# Patient Record
Sex: Male | Born: 1975 | Race: White | Hispanic: No | Marital: Married | Smoking: Never smoker
Health system: Southern US, Community
[De-identification: ages and names within clinical notes are randomized; demographics above are authoritative.]

## PROBLEM LIST (undated history)

## (undated) DIAGNOSIS — M1990 Unspecified osteoarthritis, unspecified site: Secondary | ICD-10-CM

## (undated) DIAGNOSIS — R42 Dizziness and giddiness: Secondary | ICD-10-CM

## (undated) DIAGNOSIS — Z136 Encounter for screening for cardiovascular disorders: Secondary | ICD-10-CM

## (undated) DIAGNOSIS — T8859XA Other complications of anesthesia, initial encounter: Secondary | ICD-10-CM

## (undated) DIAGNOSIS — G43909 Migraine, unspecified, not intractable, without status migrainosus: Secondary | ICD-10-CM

## (undated) DIAGNOSIS — K219 Gastro-esophageal reflux disease without esophagitis: Secondary | ICD-10-CM

## (undated) HISTORY — PX: WISDOM TOOTH EXTRACTION: SHX21

## (undated) HISTORY — DX: Dizziness and giddiness: R42

## (undated) HISTORY — PX: REFRACTIVE SURGERY: SHX103

## (undated) HISTORY — PX: OTHER SURGICAL HISTORY: SHX169

## (undated) HISTORY — PX: KNEE SURGERY: SHX244

## (undated) HISTORY — DX: Encounter for screening for cardiovascular disorders: Z13.6

## (undated) HISTORY — PX: SHOULDER SURGERY: SHX246

---

## 2000-05-25 ENCOUNTER — Emergency Department (HOSPITAL_COMMUNITY): Admission: EM | Admit: 2000-05-25 | Discharge: 2000-05-25 | Payer: Self-pay | Admitting: Emergency Medicine

## 2000-05-25 ENCOUNTER — Encounter: Payer: Self-pay | Admitting: Emergency Medicine

## 2000-05-28 ENCOUNTER — Emergency Department (HOSPITAL_COMMUNITY): Admission: EM | Admit: 2000-05-28 | Discharge: 2000-05-28 | Payer: Self-pay | Admitting: Emergency Medicine

## 2000-05-31 ENCOUNTER — Emergency Department (HOSPITAL_COMMUNITY): Admission: EM | Admit: 2000-05-31 | Discharge: 2000-05-31 | Payer: Self-pay | Admitting: Emergency Medicine

## 2001-07-13 ENCOUNTER — Emergency Department (HOSPITAL_COMMUNITY): Admission: EM | Admit: 2001-07-13 | Discharge: 2001-07-13 | Payer: Self-pay | Admitting: Emergency Medicine

## 2001-07-14 ENCOUNTER — Encounter: Payer: Self-pay | Admitting: Emergency Medicine

## 2019-10-27 ENCOUNTER — Telehealth: Payer: Self-pay

## 2019-10-27 NOTE — Telephone Encounter (Signed)
error 

## 2019-10-27 NOTE — Progress Notes (Signed)
CARDIOLOGY CONSULT NOTE       Patient ID: James Powers MRN: [id number] DOB/AGE: April 21, 1976 44 y.o.  Admit date: (Not on file) Referring Physician: Nevada Crane Primary Physician: [name], MD Primary Cardiologist: New Reason for Consultation: Palpitations  Active Problems:   * No active hospital problems. *   HPI:  44 y.o. 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 He had some dizziness and paresthesias' in his left arm after lugging a bunch of tanks for class and getting hypothermic giving his 5 mm wet suit to a student   ROS All other systems reviewed and negative except as noted above  Past Medical History:  Diagnosis Date  . Dizziness and giddiness   . Screening for cardiovascular condition     No family history on file.  Social History   Socioeconomic History  . Marital status: Married    Spouse name: Not on file  . Number of children: Not on file  . Years of education: Not on file  . Highest education level: Not on file  Occupational History  . Not on file  Tobacco Use  . Smoking status: Never Smoker  . Smokeless tobacco: Never Used  Vaping Use  . Vaping Use: Never used  Substance and Sexual Activity  . Alcohol use: Not on file  . Drug use: Not on file  . Sexual activity: Not on file  Other Topics Concern  . Not on file  Social History Narrative  . Not on file   Social Determinants of Health   Financial Resource Strain:   . Difficulty of Paying Living Expenses:   Food Insecurity:   . Worried  About Charity fundraiser in the Last Year:   . Arboriculturist in the Last Year:   Transportation Needs:   . Film/video editor (Medical):   Marland Kitchen Lack of Transportation (Non-Medical):   Physical Activity:   . Days of Exercise per Week:   . Minutes of Exercise per Session:   Stress:   . Feeling of Stress :   Social Connections:   . Frequency of Communication with Friends and Family:   . Frequency of Social Gatherings with Friends and Family:   . Attends Religious Services:   . Active Member of Clubs or Organizations:   . Attends Archivist Meetings:   Marland Kitchen Marital Status:   Intimate Partner Violence:   . Fear of Current or Ex-Partner:   . Emotionally Abused:   Marland Kitchen Physically Abused:   . Sexually Abused:      No current outpatient medications on file.  Physical Exam: Blood pressure 116/64, pulse 69, height 5\' 10"  (1.778 m), weight 200 lb 9.6 oz (91 kg), SpO2 94 %.   Affect appropriate Healthy:  appears stated age 44: normal Neck supple with no adenopathy JVP normal no bruits no thyromegaly Lungs clear with no wheezing and good diaphragmatic motion Heart:  S1/S2 no murmur, no rub, gallop or click PMI  normal Abdomen: benighn, BS positve, no tenderness, no AAA no bruit.  No HSM or HJR Distal pulses intact with no bruits No edema Neuro non-focal Skin warm and dry No muscular weakness   Labs:  No results found for: WBC, HGB, HCT, MCV, PLT No results for input(s): NA, K, CL, CO2, BUN, CREATININE, CALCIUM, PROT, BILITOT, ALKPHOS, ALT, AST, GLUCOSE in the last 168 hours.  Invalid input(s): LABALBU No results found for: CKTOTAL, CKMB, CKMBINDEX, TROPONINI No results found for: CHOL No results found for: HDL No results found for: LDLCALC No results found for: TRIG No results found for: CHOLHDL No results found for: LDLDIRECT    Radiology: No results found.  EKG: see HPI  Normal    ASSESSMENT AND PLAN:   1. Lightheadedness:  Benign related to hypothermia  not postural normal vitals / ECG 2. PAC:  Resolved no arrhythmia in office today  3. Chest Pain atypical likely muscular f/u ETT and Echo given active lifestyle and scuba diving    Signed: Jenkins Rouge 11/05/2019, 3:34 PM

## 2019-11-05 ENCOUNTER — Other Ambulatory Visit: Payer: Self-pay

## 2019-11-05 ENCOUNTER — Ambulatory Visit (INDEPENDENT_AMBULATORY_CARE_PROVIDER_SITE_OTHER): Payer: No Typology Code available for payment source | Admitting: Cardiovascular Disease

## 2019-11-05 ENCOUNTER — Encounter: Payer: Self-pay | Admitting: Cardiovascular Disease

## 2019-11-05 VITALS — BP 116/64 | HR 69 | Ht 70.0 in | Wt 200.6 lb

## 2019-11-05 DIAGNOSIS — R079 Chest pain, unspecified: Secondary | ICD-10-CM | POA: Diagnosis not present

## 2019-11-05 DIAGNOSIS — R42 Dizziness and giddiness: Secondary | ICD-10-CM

## 2019-11-05 NOTE — Patient Instructions (Signed)
Medication Instructions:  Your physician recommends that you continue on your current medications as directed. Please refer to the Current Medication list given to you today.  *If you need a refill on your cardiac medications before your next appointment, please call your pharmacy*   Lab Work: None today If you have labs (blood work) drawn today and your tests are completely normal, you will receive your results only by: Marland Kitchen MyChart Message (if you have MyChart) OR . A paper copy in the mail If you have any lab test that is abnormal or we need to change your treatment, we will call you to review the results.   Testing/Procedures: Your physician has requested that you have an exercise tolerance test. For further information please visit [email or web address]. Please also follow instruction sheet, as given.  Your physician has requested that you have an echocardiogram. Echocardiography is a painless test that uses sound waves to create images of your heart. It provides your doctor with information about the size and shape of your heart and how well your heart's chambers and valves are working. This procedure takes approximately one hour. There are no restrictions for this procedure.     Follow-Up: At [facility], you and your health needs are our priority.  As part of our continuing mission to provide you with exceptional heart care, we have created designated Provider Care Teams.  These Care Teams include your primary Cardiologist (physician) and Advanced Practice Providers (APPs -  Physician Assistants and Nurse Practitioners) who all work together to provide you with the care you need, when you need it.  We recommend signing up for the patient portal called "MyChart".  Sign up information is provided on this After Visit Summary.  MyChart is used to connect with patients for Virtual Visits (Telemedicine).  Patients are able to view lab/test results, encounter notes, upcoming appointments,  etc.  Non-urgent messages can be sent to your provider as well.   To learn more about what you can do with MyChart, go to [email or web address].    Your next appointment:  We will call you with results of testing      Thank you for choosing [facility] !

## 2019-12-11 ENCOUNTER — Other Ambulatory Visit: Payer: Self-pay

## 2019-12-11 ENCOUNTER — Other Ambulatory Visit (HOSPITAL_COMMUNITY)
Admission: RE | Admit: 2019-12-11 | Discharge: 2019-12-11 | Disposition: A | Discharge status: Home / Self Care | Payer: No Typology Code available for payment source | Source: Ambulatory Visit | Attending: Cardiovascular Disease | Admitting: Cardiovascular Disease

## 2019-12-11 DIAGNOSIS — Z20822 Contact with and (suspected) exposure to covid-19: Secondary | ICD-10-CM | POA: Diagnosis not present

## 2019-12-11 DIAGNOSIS — Z01812 Encounter for preprocedural laboratory examination: Secondary | ICD-10-CM | POA: Insufficient documentation

## 2019-12-11 LAB — SARS CORONAVIRUS 2 (TAT 6-24 HRS): SARS Coronavirus 2: NEGATIVE

## 2019-12-14 ENCOUNTER — Ambulatory Visit (HOSPITAL_COMMUNITY)
Admission: RE | Admit: 2019-12-14 | Discharge: 2019-12-14 | Disposition: A | Discharge status: Home / Self Care | Payer: No Typology Code available for payment source | Source: Ambulatory Visit | Attending: Cardiovascular Disease | Admitting: Cardiovascular Disease

## 2019-12-14 ENCOUNTER — Ambulatory Visit (HOSPITAL_BASED_OUTPATIENT_CLINIC_OR_DEPARTMENT_OTHER)
Admission: RE | Admit: 2019-12-14 | Discharge: 2019-12-14 | Disposition: A | Discharge status: Home / Self Care | Payer: No Typology Code available for payment source | Source: Ambulatory Visit | Attending: Cardiovascular Disease | Admitting: Cardiovascular Disease

## 2019-12-14 ENCOUNTER — Other Ambulatory Visit: Payer: Self-pay

## 2019-12-14 DIAGNOSIS — R079 Chest pain, unspecified: Secondary | ICD-10-CM | POA: Diagnosis not present

## 2019-12-14 LAB — EXERCISE TOLERANCE TEST
Estimated workload: 17.2 METS
Exercise duration (min): 14 min
Exercise duration (sec): 0 s
MPHR: 175 {beats}/min
Peak HR: 169 {beats}/min
Percent HR: 96 %
RPE: 13
Rest HR: 62 {beats}/min

## 2019-12-14 LAB — ECHOCARDIOGRAM COMPLETE
Area-P 1/2: 4.46 cm2
S' Lateral: 3.22 cm

## 2019-12-14 NOTE — Progress Notes (Signed)
*  PRELIMINARY RESULTS* Echocardiogram 2D Echocardiogram has been performed.  Samuel Germany 12/14/2019, 12:43 PM

## 2020-08-14 ENCOUNTER — Other Ambulatory Visit: Payer: Self-pay

## 2020-08-14 ENCOUNTER — Ambulatory Visit (INDEPENDENT_AMBULATORY_CARE_PROVIDER_SITE_OTHER): Payer: 59

## 2020-08-14 ENCOUNTER — Ambulatory Visit
Admission: RE | Admit: 2020-08-14 | Discharge: 2020-08-14 | Disposition: A | Discharge status: Home / Self Care | Payer: 59 | Source: Ambulatory Visit | Attending: Physician Assistant | Admitting: Physician Assistant

## 2020-08-14 VITALS — BP 129/89 | HR 63 | Temp 97.9°F | Resp 19

## 2020-08-14 DIAGNOSIS — S90111A Contusion of right great toe without damage to nail, initial encounter: Secondary | ICD-10-CM

## 2020-08-14 DIAGNOSIS — M79674 Pain in right toe(s): Secondary | ICD-10-CM

## 2020-08-14 NOTE — ED Provider Notes (Signed)
[facility]    CSN: [id number] Arrival date & time: 08/14/20  1301      History   Chief Complaint Chief Complaint  Patient presents with  . Toe Injury    HPI James Powers is a 45 y.o. male.   Pt presents with right great to pain that started on Thursday.  Reports he injured the toe while doing taekwondo.  Reports nail pushed in, had some mild bleeding at the base of the nail.  Nail is intact.  He reports the next day someone stepped on his toe and now he is experiencing increased redness and swelling to the great toe.  Reports pain is worse with walking.  No other injuries.       Past Medical History:  Diagnosis Date  . Dizziness and giddiness   . Screening for cardiovascular condition     There are no problems to display for this patient.   Past Surgical History:  Procedure Laterality Date  . KNEE SURGERY Left   . SHOULDER SURGERY Left        Home Medications    Prior to Admission medications   Not on File    Family History No family history on file.  Social History Social History   Tobacco Use  . Smoking status: Never Smoker  . Smokeless tobacco: Never Used  Vaping Use  . Vaping Use: Never used     Allergies   Tomato   Review of Systems Review of Systems  Constitutional: Negative for chills and fever.  HENT: Negative for ear pain and sore throat.   Eyes: Negative for pain and visual disturbance.  Respiratory: Negative for cough and shortness of breath.   Cardiovascular: Negative for chest pain and palpitations.  Gastrointestinal: Negative for abdominal pain and vomiting.  Genitourinary: Negative for dysuria and hematuria.  Musculoskeletal: Positive for arthralgias (right great toe pain). Negative for back pain.  Skin: Negative for color change, rash and wound.  Neurological: Negative for seizures and syncope.  All other systems reviewed and are negative.    Physical Exam Triage Vital Signs ED Triage Vitals  Enc Vitals  Group     BP 08/14/20 1314 129/89     Pulse Rate 08/14/20 1314 63     Resp 08/14/20 1314 19     Temp 08/14/20 1314 97.9 F (36.6 C)     Temp Source 08/14/20 1314 Oral     SpO2 08/14/20 1314 98 %     Weight --      Height --      Head Circumference --      Peak Flow --      Pain Score 08/14/20 1312 2     Pain Loc --      Pain Edu? --      Excl. in [place]? --    No data found.  Updated Vital Signs BP 129/89 (BP Location: Right Arm)   Pulse 63   Temp 97.9 F (36.6 C) (Oral)   Resp 19   SpO2 98%   Visual Acuity Right Eye Distance:   Left Eye Distance:   Bilateral Distance:    Right Eye Near:   Left Eye Near:    Bilateral Near:     Physical Exam Vitals and nursing note reviewed.  Constitutional:      Appearance: He is well-developed.  HENT:     Head: Normocephalic and atraumatic.  Eyes:     Conjunctiva/sclera: Conjunctivae normal.  Cardiovascular:     Rate  and Rhythm: Normal rate and regular rhythm.     Heart sounds: No murmur heard.   Pulmonary:     Effort: Pulmonary effort is normal. No respiratory distress.     Breath sounds: Normal breath sounds.  Abdominal:     Palpations: Abdomen is soft.     Tenderness: There is no abdominal tenderness.  Musculoskeletal:     Cervical back: Neck supple.     Left foot: Normal range of motion. Normal pulse.     Comments: Redness along the base of the right great toe nail bed with mild swelling.  Possible paronychia vs. Injury, no fluctuance noted.  Nail is intact with no nail damage noted.    Skin:    General: Skin is warm and dry.  Neurological:     Mental Status: He is alert.      UC Treatments / Results  Labs (all labs ordered are listed, but only abnormal results are displayed) Labs Reviewed - No data to display  EKG   Radiology DG Toe Great Right  Result Date: 08/14/2020 CLINICAL DATA:  Right great toe pain and swelling EXAM: RIGHT GREAT TOE COMPARISON:  None. FINDINGS: There is no evidence of fracture or  dislocation. Minimal osteophytic spurring of the first MTP joint. Soft tissues are unremarkable. IMPRESSION: Negative. Electronically Signed   By: Davina Poke D.O.   On: 08/14/2020 13:39    Procedures Procedures (including critical care time)  Medications Ordered in UC Medications - No data to display  Initial Impression / Assessment and Plan / UC Course  I have reviewed the triage vital signs and the nursing notes.  Pertinent labs & imaging results that were available during my care of the patient were reviewed by me and considered in my medical decision making (see chart for details).     Negative images.  Great toe contusion vs. Possible paronychia although no fluctuance noted.  Advised warm soaks and follow up if swelling redness persists/becomes worse.  Otherwise treat as contusion, ice, elevation, ibuprofen.  Final Clinical Impressions(s) / UC Diagnoses   Final diagnoses:  Contusion of right great toe without damage to nail, initial encounter     Discharge Instructions     Apply ice to affected areas as needed for pain Recommend warm soaks for possibly paronychia Ibuprofen or tylenol as needed for pain Return if swelling, redness around toenail increases   ED Prescriptions    None     PDMP not reviewed this encounter.   Konrad Felix, PA-C 08/15/20 1842

## 2020-08-14 NOTE — ED Triage Notes (Signed)
Injury to RT great toe on Thursday.  Friday a kid stepped on toe and is now having swelling/ pain.

## 2020-08-14 NOTE — Discharge Instructions (Addendum)
Apply ice to affected areas as needed for pain Recommend warm soaks for possibly paronychia Ibuprofen or tylenol as needed for pain Return if swelling, redness around toenail increases

## 2020-12-02 ENCOUNTER — Ambulatory Visit (INDEPENDENT_AMBULATORY_CARE_PROVIDER_SITE_OTHER): Payer: 59 | Admitting: Allergy & Immunology

## 2020-12-02 ENCOUNTER — Other Ambulatory Visit: Payer: Self-pay

## 2020-12-02 ENCOUNTER — Encounter: Payer: Self-pay | Admitting: Allergy & Immunology

## 2020-12-02 VITALS — BP 124/72 | HR 75 | Temp 98.2°F | Resp 18 | Ht 70.0 in | Wt 222.4 lb

## 2020-12-02 DIAGNOSIS — L508 Other urticaria: Secondary | ICD-10-CM | POA: Diagnosis not present

## 2020-12-02 DIAGNOSIS — T781XXD Other adverse food reactions, not elsewhere classified, subsequent encounter: Secondary | ICD-10-CM

## 2020-12-02 DIAGNOSIS — T7840XA Allergy, unspecified, initial encounter: Secondary | ICD-10-CM | POA: Insufficient documentation

## 2020-12-02 DIAGNOSIS — T7840XD Allergy, unspecified, subsequent encounter: Secondary | ICD-10-CM

## 2020-12-02 MED ORDER — EPINEPHRINE 0.3 MG/0.3ML IJ SOAJ: 0.3000 mg | INTRAMUSCULAR | 1 refills | Status: DC | PRN

## 2020-12-02 MED ORDER — FAMOTIDINE 20 MG PO TABS: 20.0000 mg | ORAL_TABLET | Freq: Two times a day (BID) | ORAL | 5 refills | Status: DC

## 2020-12-02 NOTE — Progress Notes (Signed)
NEW PATIENT  Date of Service/Encounter:  12/02/20  Consult requested by: [name], MD   Assessment:   Allergic reaction - unknown trigger  Chronic urticaria   James Powers presents for an evaluation of 2 different distinct atopic etiologies.  One of these is a rash, which sounds consistent with cholinergic urticaria.  However, he does not have pictures to confirm this.  The other is whole body hives, which seem to be occurring with increasing frequency.  He has not been able to find out a trigger of the symptoms.  Testing today is notable for nonreactive findings to the entire food panel. We are going to get some labs to rule out serious causes of hives/swelling.  In the interim, we are putting him on suppressive doses of antihistamines.  We will call him in 1 to 2 weeks with the results of the testing.  We are providing him with training for an epinephrine autoinjector.  We are also giving him a topical steroid that he can use on this rash.  Plan/Recommendations:   1. Allergic reaction - unknown trigger - I am not sure what is going on with you. - All of the testing we did today was negative. - There is a the low positive predictive value of food allergy testing and hence the high possibility of false positives. - In contrast, food allergy testing has a high negative predictive value, therefore if testing is negative we can be relatively assured that they are indeed negative.  - I would go ahead and put wheat back into your diet since it did not seem to help your symptoms anyway. - Your allergy level was not concerning from my perspective. - Copy of testing results provided. - There is no test to look for any kind of sugar allergy because you really cannot develop an allergy to sugars (it needs to be a protein for your immune system to react to it).  - However, you could have some kind of sugar intolerance. - I am not sure what are causing your reactions, but I do worry that they could  progress to become something more serious. - Therefore, I am going to send in an Auvi-Q (epinephrine autoinjector) to have on hand if needed. - It should be only a $25 co-pay and they will send you the medication in the mail. - Anaphylaxis management plan provided.  2. Chronic urticaria - I want some pictures of this acne-like rash when it happens. - I am going to add on topical triamcinolone to apply to the lesions to see if this helps at all (avoid the face, but you can use it over the rest of your body). - I am going to add on hydrocortisone 2.5% cream to apply to the lesions to see if this helps at all (SAFE to use on the face). - Your history does not have any "red flags" such as fevers, joint pains, or permanent skin changes that would be concerning for a more serious cause of hives.  - We will get some labs to rule out serious causes of hives: alpha gal panel, tryptase level, chronic urticaria panel, stinging insect panel, ESR, and CRP. - Chronic hives are often times a self limited process and will "burn themselves out" over 6-12 months, although this is not always the case.  - In the meantime, start suppressive dosing of antihistamines to help prevent these rashes.   - Morning: Allegra (fexofenadine) 130m EVERY DAY  - Evening: Zyrtec (cetirizine) 144mEVERY  DAY  - If the above is not working, try adding: Pepcid (famotidine) 64m - You can change this dosing at home, decreasing the dose as needed or increasing the dosing as needed.  - If you are not tolerating the medications or are tired of taking them every day, we can start treatment with a monthly injectable medication called Xolair.   3. Return in about 6 weeks (around 01/13/2021).     This note in its entirety was forwarded to the Provider who requested this consultation.  Subjective:   James Powers a 45y.o. male presenting today for evaluation of  Chief Complaint  Patient presents with   Allergy Testing    Off of  all antihistamines - wheat, tomatoes, peppers(jalapeno)    Allergic Reaction    Past 6-7 years would break out randomly with red raised bumps all started after getting bitten by a copper head snake July 4, 20214. When through anaphylactic shock randomly during playing basketball, and had a couple of times where he had random swelling and red raised bumps. Tried not eating meat, tomatoes, wheat, and still not sure the cause.    Other    Thought it was bed bugs and that was not the cause and is not sure if its food related. Swelling, itching, headaches, red raised bumps.     James Powers a history of the following: Patient Active Problem List   Diagnosis Date Noted   Chronic urticaria 12/02/2020   Allergic reaction 12/02/2020     History obtained from: chart review and patient.  BDenver Fasterwas referred by [name].     James Powers a 45y.o. male presenting for an evaluation of allergic reactions.  He attributes this to symptoms following his snake bite. Symptoms started in September 2019. He was scratching the sides of his head and he was extremely pruritic. He was "not looking good" and his lip and tongues were swollen. He went to [facility] and popped some Benadryl. He went home and felt that he should have gone to the ED. HE was purple head to toe and had hives everywhere. These were not the itchy dots but completely different. He was cleared up within 2 hours. He had eaten [name] with James Powers   In September 2020, he started having similar symptoms when he was outdoors working in the yard. He cooled down and sat down and took Benadryl with improvement in his symptoms. He had eaten fajitas for lunch but this was 2 hours prior. He was playing basketball at that time.   He was vegetarian for two months without improvement. He avoided red meats without improvement.   In April 2021, around midnight, he woke up and went to the bathroom and stayed awake for one  hour. It never got as bad as the first time because. In July 2021 and October 2021, he had similar episodes. Both of these others were around 4am or so.   He has avoided tomatoes, spicy foods, and acidic foods. He denies any stings at all.   In September 2021, he developed headaches. As part of this headache workup, he did wheat testing that positive. He has been avoiding gluten since March 2022. This did help at first but then it resumed. He has having less outbreaks as he used to. He thought the wheat might be causing the headaches. They are better going to a massage therapist, around once weekly for the past couple of months.  He reports migraines and vertigo on Monday or Tuesday night. He does not drink coffee or soft drinks during the week, only on the weekends. He decreased the caffeine intake and this improved his migraines. He has avoided all caffeine and he has not had migraines since stopping in December 2021.  Review of his lab results showed complete blood count that was normal.  Metabolic panel was normal as well.  He did have celiac testing that was normal.  Inflammatory markers were normal.  Vitamin D was slightly low.  Thyroid testing was normal.  Wheat IgE was 3.10.  Vitamin B-12 was normal.  His wife is a Child psychotherapist a he has noticed that he develops headaches over the next couple of days. All of his food allergies are delayed. They are never associated with medications. He does not use ibuprofen often at all.   In between all of these, he reports that the bumps would look like acne. It would appear on the cheeks and forehead and on the tops of his head. He also has them on his head. They are extremely itchy and not grouped together. They are very scattered. They occur randomly and they would remain for 4-5 days with a few weeks between them. He is outside a lot and during the summer he would develop the bites. He has had the house sprayer and bed bugs checked. These are what developed after the  copperhead bite in July 2014.   Of note, his brother developed similar symptoms. He has needed to call 911 and he carries an EpiPen. Alick does not carry an EpiPen at all. He does not talk to his brother very often.   He does report that he tends to require more local anesthetics and general anesthetics during procedures.  He has woken up during a knee procedure as well as a dental procedure.  He tells me that his last procedure was a shoulder injury and he told his orthopedic surgeon about this who sent a "protein test" which was positive.  He does not know any more details of this, but he tells me that this helped his orthopedic surgeon to choose an anesthetic for the surgery.  In any case, this was 2013 and the records have likely been destroyed or would be difficult to find.  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  He otherwise has no exciting exposures.  Otherwise, there is no history of other atopic diseases, including asthma, drug allergies, stinging insect allergies, or contact dermatitis. There is no significant infectious history. Vaccinations are up to date.    Past Medical History: Patient Active Problem List   Diagnosis Date Noted   Chronic urticaria 12/02/2020   Allergic reaction 12/02/2020     Medication List:  Allergies as of 12/02/2020       Reactions   Tomato Hives        Medication List        Accurate as of December 02, 2020  1:17 PM. If you have any questions, ask your nurse or doctor.          EPINEPHrine 0.3 mg/0.3 mL Soaj injection Commonly known as: Auvi-Q Inject 0.3 mg into the muscle as needed for anaphylaxis. Started by: Valentina Shaggy, MD   famotidine 20 MG tablet Commonly known as: PEPCID Take 1 tablet (20 mg total) by mouth 2 (two) times daily. Started by: Valentina Shaggy, MD        Birth  History: non-contributory  Developmental History: non-contributory  Past Surgical History: Past  Surgical History:  Procedure Laterality Date   KNEE SURGERY Left    SHOULDER SURGERY Left      Family History: History reviewed. No pertinent family history.   Social History: Trevelle lives at home with his wife and three daughters.  Does have a house that is 55 years old.  There is laminate and carpeting throughout the home.  They have electric heating and central cooling.  There are no animals inside or outside of the home.  There are no dust mite covers on the bedding.  There is no tobacco exposure.  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  He is not exposed to fumes, chemicals, or dust.  He does not have a HEPA filter.  There is no tobacco exposure.   Review of Systems  Constitutional: Negative.  Negative for chills, fever, malaise/fatigue and weight loss.  HENT: Negative.  Negative for congestion, ear discharge, ear pain, sinus pain and sore throat.   Eyes:  Negative for pain, discharge and redness.  Respiratory:  Negative for cough, sputum production, shortness of breath and wheezing.   Cardiovascular: Negative.  Negative for chest pain and palpitations.  Gastrointestinal:  Negative for abdominal pain, constipation, diarrhea, heartburn, nausea and vomiting.  Skin:  Positive for itching and rash.  Neurological:  Negative for dizziness and headaches.  Endo/Heme/Allergies:  Negative for environmental allergies. Does not bruise/bleed easily.      Objective:   Blood pressure 124/72, pulse 75, temperature 98.2 F (36.8 C), resp. rate 18, height 5' 10"  (1.778 m), weight 222 lb 6.4 oz (100.9 kg), SpO2 98 %. Body mass index is 31.91 kg/m.   Physical Exam:   Physical Exam Constitutional:      Appearance: He is well-developed.  HENT:     Head: Normocephalic and atraumatic.     Right Ear: Tympanic membrane, ear canal and external ear normal. No drainage, swelling or tenderness. Tympanic membrane is not injected, scarred, erythematous, retracted or bulging.      Left Ear: Tympanic membrane, ear canal and external ear normal. No drainage, swelling or tenderness. Tympanic membrane is not injected, scarred, erythematous, retracted or bulging.     Nose: No nasal deformity, septal deviation, mucosal edema or rhinorrhea.     Right Turbinates: Enlarged and swollen.     Left Turbinates: Enlarged and swollen.     Right Sinus: No maxillary sinus tenderness or frontal sinus tenderness.     Left Sinus: No maxillary sinus tenderness or frontal sinus tenderness.     Mouth/Throat:     Mouth: Mucous membranes are not pale and not dry.     Pharynx: Uvula midline.  Eyes:     General:        Right eye: No discharge.        Left eye: No discharge.     Conjunctiva/sclera: Conjunctivae normal.     Right eye: Right conjunctiva is not injected. No chemosis.    Left eye: Left conjunctiva is not injected. No chemosis.    Pupils: Pupils are equal, round, and reactive to light.  Cardiovascular:     Rate and Rhythm: Normal rate and regular rhythm.     Heart sounds: Normal heart sounds.  Pulmonary:     Effort: Pulmonary effort is normal. No tachypnea, accessory muscle usage or respiratory distress.     Breath sounds: Normal breath sounds. No wheezing, rhonchi or rales.  Comments: Moving air well in all lung fields. No increased work of breathing noted.  Chest:     Chest wall: No tenderness.  Abdominal:     Tenderness: There is no abdominal tenderness. There is no guarding or rebound.  Lymphadenopathy:     Head:     Right side of head: No submandibular, tonsillar or occipital adenopathy.     Left side of head: No submandibular, tonsillar or occipital adenopathy.     Cervical: No cervical adenopathy.  Skin:    General: Skin is warm.     Capillary Refill: Capillary refill takes less than 2 seconds.     Coloration: Skin is not pale.     Findings: No abrasion, erythema, petechiae or rash. Rash is not papular, urticarial or vesicular.     Comments: No urticarial  lesions noted. He does have some papular lesions over his bilateral legs.   Neurological:     Mental Status: He is alert.  Psychiatric:        Behavior: Behavior is cooperative.     Diagnostic studies:   Allergy Studies:     Food Adult Perc - 12/02/20 0900     Time Antigen Placed 0915    Allergen Manufacturer Lavella Hammock    Location Back    Number of allergen test 72     Control-buffer 50% Glycerol Negative    Control-Histamine 1 mg/ml 2+    1. Peanut Negative    2. Soybean Negative    3. Wheat Negative    4. Sesame Negative    5. Milk, cow Negative    6. Egg White, Chicken Negative    7. Casein Negative    8. Shellfish Mix Negative    9. Fish Mix Negative    10. Cashew Negative    11. Pecan Food Negative    12. [place] Negative    13. Almond Negative    14. Hazelnut Negative    15. [place] nut Negative    16. Coconut Negative    17. Pistachio Negative    18. Catfish Negative    19. Bass Negative    20. Trout Negative    21. Tuna Negative    22. Salmon Negative    23. Flounder Negative    24. Codfish Negative    25. Shrimp Negative    26. Crab Negative    27. Lobster Negative    28. Oyster Negative    29. Scallops Negative    30. Barley Negative    31. Oat  Negative    32. Rye  Negative    33. Hops Negative    34. Rice Negative    35. Cottonseed Negative    36. Saccharomyces Cerevisiae  Negative    37. Pork Negative    38. [place] Meat Negative    39. Chicken Meat Negative    40. Beef Negative    41. Lamb Negative    42. Tomato Negative    43. White Potato Negative    44. Sweet Potato Negative    45. Pea, Green/English Negative    46. Navy Bean Negative    47. Mushrooms Negative    48. Avocado Negative    49. Onion Negative    50. Cabbage Negative    51. Carrots Negative    52. Celery Negative    53. Corn Negative    54. Cucumber Negative    55. Grape (White seedless) Negative    56. Orange  Negative  57. Banana Negative    58. Apple Negative     59. Peach Negative    60. Strawberry Negative    61. Cantaloupe Negative    62. Watermelon Negative    63. Pineapple Negative    64. Chocolate/Cacao bean Negative    65. Karaya Gum Negative    66. Acacia (Arabic Gum) Negative    67. Cinnamon Negative    68. Nutmeg Negative    69. Ginger Negative    70. Garlic Negative    71. Pepper, black Negative    72. Mustard Negative             Allergy testing results were read and interpreted by myself, documented by clinical staff.         Salvatore Marvel, MD [facility] [facility] [place]

## 2020-12-02 NOTE — Patient Instructions (Addendum)
1. Allergic reaction - unknown trigger - I am not sure what is going on with you. - All of the testing we did today was negative. - There is a the low positive predictive value of food allergy testing and hence the high possibility of false positives. - In contrast, food allergy testing has a high negative predictive value, therefore if testing is negative we can be relatively assured that they are indeed negative.  - I would go ahead and put wheat back into your diet since it did not seem to help your symptoms anyway. - Your allergy level was not concerning from my perspective. - Copy of testing results provided. - There is no test to look for any kind of sugar allergy because you really cannot develop an allergy to sugars (it needs to be a protein for your immune system to react to it).  - However, you could have some kind of sugar intolerance. - I am not sure what are causing your reactions, but I do worry that they could progress to become something more serious. - Therefore, I am going to send in an Auvi-Q (epinephrine autoinjector) to have on hand if needed. - It should be only a $25 co-pay and they will send you the medication in the mail. - Anaphylaxis management plan provided.  2. Chronic urticaria - I want some pictures of this acne-like rash when it happens. - I am going to add on topical triamcinolone to apply to the lesions to see if this helps at all (avoid the face, but you can use it over the rest of your body). - I am going to add on hydrocortisone 2.5% cream to apply to the lesions to see if this helps at all (SAFE to use on the face). - Your history does not have any "red flags" such as fevers, joint pains, or permanent skin changes that would be concerning for a more serious cause of hives.  - We will get some labs to rule out serious causes of hives: alpha gal panel, tryptase level, chronic urticaria panel, stinging insect panel, ESR, and CRP. - Chronic hives are often times  a self limited process and will "burn themselves out" over 6-12 months, although this is not always the case.  - In the meantime, start suppressive dosing of antihistamines to help prevent these rashes.   - Morning: Allegra (fexofenadine) 195m EVERY DAY  - Evening: Zyrtec (cetirizine) 176mEVERY DAY  - If the above is not working, try adding: Pepcid (famotidine) 2026m You can change this dosing at home, decreasing the dose as needed or increasing the dosing as needed.  - If you are not tolerating the medications or are tired of taking them every day, we can start treatment with a monthly injectable medication called Xolair.   3. Return in about 6 weeks (around 01/13/2021).    Please inform us [place] any Emergency Department visits, hospitalizations, or changes in symptoms. Call us [place] going to the ED for breathing or allergy symptoms since we might be able to fit you in for a sick visit. Feel free to contact us [place] with any questions, problems, or concerns.  It was a pleasure to meet you today!  Websites that have reliable patient information: 1. American Academy of Asthma, Allergy, and Immunology: www.aaaai.org 2. Food Allergy Research and Education (FARE): foodallergy.org 3. Mothers of Asthmatics: http://www.asthmacommunitynetwork.org 4. American College of Allergy, Asthma, and Immunology: www.acaai.org   COVID-19 Vaccine Information can be found at: [email or web address]  questions related to vaccine distribution or appointments, please email [email or web address] or call [phone].   We realize that you might be concerned about having an allergic reaction to the COVID19 vaccines. To help with that concern, WE ARE OFFERING THE COVID19 VACCINES IN OUR OFFICE! Ask the front desk for dates!     "Like" [place] on Facebook and Instagram for our latest updates!      A healthy democracy works best when [employer] participate!  Make sure you are registered to vote! If you have moved or changed any of your contact information, you will need to get this updated before voting!  In some cases, you MAY be able to register to vote online: [email or web address]    1. Peanut Negative   2. Soybean Negative   3. Wheat Negative   4. Sesame Negative   5. Milk, cow Negative   6. Egg White, Chicken Negative   7. Casein Negative   8. Shellfish Mix Negative   9. Fish Mix Negative   10. Cashew Negative   11. Pecan Food Negative   12. [place] Negative   13. Almond Negative   14. Hazelnut Negative   15. [place] nut Negative   16. Coconut Negative   17. Pistachio Negative   18. Catfish Negative   19. Bass Negative   20. Trout Negative   21. Tuna Negative   22. Salmon Negative   23. Flounder Negative   24. Codfish Negative   25. Shrimp Negative   26. Crab Negative   27. Lobster Negative   28. Oyster Negative   29. Scallops Negative   30. Barley Negative   31. Oat  Negative   32. Rye  Negative   33. Hops Negative   34. Rice Negative   35. Cottonseed Negative   36. Saccharomyces Cerevisiae  Negative   37. Pork Negative   38. [place] Meat Negative   39. Chicken Meat Negative   40. Beef Negative   41. Lamb Negative   42. Tomato Negative   43. White Potato Negative   44. Sweet Potato Negative   45. Pea, Green/English Negative   46. Navy Bean Negative   47. Mushrooms Negative   48. Avocado Negative   49. Onion Negative   50. Cabbage Negative   51. Carrots Negative   52. Celery Negative   53. Corn Negative   54. Cucumber Negative   55. Grape (White seedless) Negative   56. Orange  Negative   57. Banana Negative   58. Apple Negative   59. Peach Negative   60. Strawberry Negative   61. Cantaloupe Negative   62. Watermelon Negative   63. Pineapple Negative   64. Chocolate/Cacao bean Negative   65. Karaya Gum Negative   66. Acacia (Arabic Gum) Negative   67. Cinnamon  Negative   68. Nutmeg Negative   69. Ginger Negative   70. Garlic Negative   71. Pepper, black Negative   72. Mustard Negative

## 2020-12-12 ENCOUNTER — Ambulatory Visit
Admission: RE | Admit: 2020-12-12 | Discharge: 2020-12-12 | Disposition: A | Discharge status: Home / Self Care | Payer: 59 | Source: Ambulatory Visit | Attending: Family Medicine | Admitting: Family Medicine

## 2020-12-12 ENCOUNTER — Other Ambulatory Visit: Payer: Self-pay

## 2020-12-12 VITALS — BP 126/83 | HR 64 | Temp 98.3°F | Resp 18

## 2020-12-12 DIAGNOSIS — L237 Allergic contact dermatitis due to plants, except food: Secondary | ICD-10-CM | POA: Diagnosis not present

## 2020-12-12 MED ORDER — DEXAMETHASONE SODIUM PHOSPHATE 10 MG/ML IJ SOLN
10.0000 mg | Freq: Once | INTRAMUSCULAR | Status: AC
  Administered 2020-12-12: 10 mg via INTRAMUSCULAR

## 2020-12-12 MED ORDER — TRIAMCINOLONE ACETONIDE 0.1 % EX CREA
1.0000 | TOPICAL_CREAM | Freq: Two times a day (BID) | CUTANEOUS | 0 refills | Status: DC
Start: 2020-12-12 — End: 2021-02-08

## 2020-12-12 NOTE — ED Provider Notes (Signed)
[facility]    CSN: [id number] Arrival date & time: 12/12/20  0955      History   Chief Complaint Chief Complaint  Patient presents with   Rash    HPI James Powers is a 45 y.o. male.   HPI Patient presents today with bilateral blistery papular rash involving both of the extremities which is pruritic and draining in certain areas x3 days.  He reports contact with poison ivy vines comes 3 days ago.  Initially noticed a small patch on his lower wrist which has now expanded to include bilateral arms.  The rash is not affecting his face and he denies any difficulty with swallowing or breathing.  Past Medical History:  Diagnosis Date   Dizziness and giddiness    Screening for cardiovascular condition     Patient Active Problem List   Diagnosis Date Noted   Chronic urticaria 12/02/2020   Allergic reaction 12/02/2020    Past Surgical History:  Procedure Laterality Date   KNEE SURGERY Left    SHOULDER SURGERY Left        Home Medications    Prior to Admission medications   Medication Sig Start Date End Date Taking? Authorizing Provider  triamcinolone cream (KENALOG) 0.1 % Apply 1 application topically 2 (two) times daily. 12/12/20  Yes Scot Jun, FNP  EPINEPHrine (AUVI-Q) 0.3 mg/0.3 mL IJ SOAJ injection Inject 0.3 mg into the muscle as needed for anaphylaxis. 12/02/20   Valentina Shaggy, MD  famotidine (PEPCID) 20 MG tablet Take 1 tablet (20 mg total) by mouth 2 (two) times daily. 12/02/20   Valentina Shaggy, MD    Family History History reviewed. No pertinent family history.  Social History Social History   Tobacco Use   Smoking status: Never   Smokeless tobacco: Never  Vaping Use   Vaping Use: Never used     Allergies   Tomato   Review of Systems Review of Systems Pertinent negatives listed in HPI   Physical Exam Triage Vital Signs ED Triage Vitals  Enc Vitals Group     BP 12/12/20 1024 126/83     Pulse Rate  12/12/20 1024 64     Resp 12/12/20 1024 18     Temp 12/12/20 1024 98.3 F (36.8 C)     Temp src --      SpO2 12/12/20 1024 98 %     Weight --      Height --      Head Circumference --      Peak Flow --      Pain Score 12/12/20 1022 0     Pain Loc --      Pain Edu? --      Excl. in [place]? --    No data found.  Updated Vital Signs BP 126/83   Pulse 64   Temp 98.3 F (36.8 C)   Resp 18   SpO2 98%   Visual Acuity Right Eye Distance:   Left Eye Distance:   Bilateral Distance:    Right Eye Near:   Left Eye Near:    Bilateral Near:     Physical Exam General appearance: Alert, well developed, well nourished, cooperative  Head: Normocephalic, without obvious abnormality, atraumatic Respiratory: Respirations even and unlabored, normal respiratory rate Heart: Rate and rhythm normal. No gallop or murmurs noted on exam  Abdomen: BS +, no distention, no rebound tenderness, or no mass Extremities: Bilateral upper extremities : erythematous macular vesicular blistery papular rash bilateral upper  extremities. Skin: Skin color, texture, turgor normal.  Psych: Appropriate mood and affect. Neurologic: GCS 15, normal coordination,normal gait   UC Treatments / Results  Labs (all labs ordered are listed, but only abnormal results are displayed) Labs Reviewed - No data to display  EKG   Radiology No results found.  Procedures Procedures (including critical care time)  Medications Ordered in UC Medications  dexamethasone (DECADRON) injection 10 mg (10 mg Intramuscular Given 12/12/20 1108)    Initial Impression / Assessment and Plan / UC Course  I have reviewed the triage vital signs and the nursing notes.  Pertinent labs & imaging results that were available during my care of the patient were reviewed by me and considered in my medical decision making (see chart for details).    Contact dermatitis due to poison ivy.  Decadron 10 mg IM given here in clinic.  Patient will  continue home management with triamcinolone cream and cetirizine daily until rash resolves.  Return precautions given if symptoms worsen or do not improve. Final Clinical Impressions(s) / UC Diagnoses   Final diagnoses:  Contact dermatitis due to poison ivy   Discharge Instructions   None    ED Prescriptions     Medication Sig Dispense Auth. Provider   triamcinolone cream (KENALOG) 0.1 % Apply 1 application topically 2 (two) times daily. 454 g Scot Jun, FNP      PDMP not reviewed this encounter.   Scot Jun, [place] 12/12/20 [phone]

## 2020-12-12 NOTE — ED Triage Notes (Signed)
Pt presents with rash on arms after working in yard on Friday

## 2021-01-12 NOTE — Patient Instructions (Addendum)
Alpha gal allergy Avoid all red meats, dairy products, and gelatin foods/medications. In case of an allergic reaction, give Benadryl 4 teaspoonfuls every 4 hours, and if life-threatening symptoms occur, inject with AuviQ 0.3 mg.  Stinging insect allergy Avoid stinging insects. In case of an allergic reaction, give Benadryl 4 teaspoonfuls every 4 hours, and if life-threatening symptoms occur, inject with AuviQ 0.3 mg  Allergic reaction-unknown trigger If your symptoms re-occur, begin a journal of events that occurred for up to 6 hours before your symptoms began including foods and beverages consumed, soaps or perfumes you had contact with, and medications.   In case of an allergic reaction, give Benadryl 24feaspoonfuls every 4 hours, and if life-threatening symptoms occur, inject with AuviQ 0.3 mg.  Chronic urticaria Lets see if avoiding all red meats, dairy, and gelatin foods/medications helps with your hives. All of your lab results are not back. We will call you once they are back. You may use Allegra 180 mg every morning You may use Zyrtec 10 mg at night May add on Pepcid if symptoms are not controlled Consider Xolair in the future if medications above are not helping  Stop triamcinolone 0.1% cream since you are wondering if it caused your hives. We can consider patch testing in the future Stop hydrocortisone 2.5% cream  since you are wondering if it caused your hives.   Please let [place] if this treatment plan is not working well for you. Schedule a follow up appointment in 4 weeks or sooner if needed

## 2021-01-13 ENCOUNTER — Ambulatory Visit (INDEPENDENT_AMBULATORY_CARE_PROVIDER_SITE_OTHER): Payer: 59 | Admitting: Family

## 2021-01-13 ENCOUNTER — Other Ambulatory Visit: Payer: Self-pay

## 2021-01-13 ENCOUNTER — Encounter: Payer: Self-pay | Admitting: Family

## 2021-01-13 VITALS — BP 110/70 | HR 72 | Temp 98.7°F | Resp 18 | Ht 70.0 in | Wt 223.4 lb

## 2021-01-13 DIAGNOSIS — Z91018 Allergy to other foods: Secondary | ICD-10-CM

## 2021-01-13 DIAGNOSIS — T63441D Toxic effect of venom of bees, accidental (unintentional), subsequent encounter: Secondary | ICD-10-CM

## 2021-01-13 DIAGNOSIS — L508 Other urticaria: Secondary | ICD-10-CM | POA: Diagnosis not present

## 2021-01-13 DIAGNOSIS — T7840XD Allergy, unspecified, subsequent encounter: Secondary | ICD-10-CM

## 2021-01-13 MED ORDER — EPINEPHRINE 0.3 MG/0.3ML IJ SOAJ: 0.3000 mg | INTRAMUSCULAR | 1 refills | Status: AC | PRN

## 2021-01-13 NOTE — Progress Notes (Signed)
[place], [address] [place] [address] Dept: [phone]  FOLLOW UP NOTE  Patient ID: James Powers, male    DOB: May 05, 1976  Age: 45 y.o. MRN: [id number] Date of Office Visit: 01/13/2021  Assessment  Chief Complaint: Allergic Reaction  HPI James Powers is a 45 year old male who presents today for follow-up of allergic reaction-unknown trigger and chronic urticaria.  He was last seen on December 02, 2020 by Dr. Ernst Bowler.     He reports since his last office visit he went to urgent care on July 25 for poison ivy where he was given a steroid injection.  On July 22 he was cleaning up a tree that had for poison ivy on it from where his neighbors tree fell down in his yard. Two hours later he was scratching on his left arm.  He then noticed patches on his right arm, stomach, and legs.  On Monday he went to urgent care and was given a steroid shot that helped for 2 days then it stopped working.  He started using triamcinolone cream for 4 days and then stopped using it because he developed hives where he placed the triamcinolone cream.  He is not certain if the hives were caused to the triamcinolone cream or if it was due to heat.  He then started using over-the-counter Clearasil and things started to gradually clear up.  He mentions that he gets several different kinds of rashes.  He reports that he will get little patches of bumps that will be itchy.  They can last anywhere from 1 day to a week later.  These bumps are usually flesh colored.  He then also got a new rash since we last saw him.  It for started on his right arm and then he had some on the left side of his face /temple region. He has pictures of these rashes. These areas did not itch, but they hurt.  The areas on his arm are gone now and he has 1 bump left on his left temple region.  He is currently not taking Allegra 180 mg in the morning and Zyrtec 10 mg at night.  He did try Allegra once a day for a short period, but stopped  when urgent care recommended he take Benadryl for the poison ivy.  He reports that he does like to take medicine.  He has seen problems from people taking daily medications.  He would rather change his diet and take daily medicine.   He reports that he eats very little red meat and mainly eats ground [place], chicken, and fish.  He does eat dairy and does have  a gelatin capsule medication.  He  does have his Auvi-Q and has not had to use it since we last saw him.  He reports that he went vegetarian for approximately 2 months 4 years ago and did not notice any difference.  He does remember though getting a tick bite around the same time he was bit by a snake in 2014 or 2015.  He mentions that approximately 10 years ago he was stung 12-13 times by a yellowjacket on his foot and just had swelling in that foot.  He took Benadryl and did fine.  He denies any concomitant cardiorespiratory and gastrointestinal symptoms.  Also 3 to 4 years ago he was stung by a wasp and denies any reaction.   He mentions that around 4 PM the day of his last office visit with skin testing to foods that he  had an area pop up on his back that was itchy.  He reports that his body reacts differently and slower.  He mentions that when he has had surgery he is had to be given anesthesia twice because he wakes up.  He also reports that he was eating at [employer] and had their sweet and spicy shrimp and 2 days later developed a rash all over.  He wonders if it could have been the green, yellow, red peppers or the breading on the shrimp.  He also mentions that he was drinking lemonade and this is something that he normally does not drink.  Drug Allergies:  Allergies  Allergen Reactions   Tomato Hives    Review of Systems: Review of Systems  Constitutional:  Negative for chills and fever.  HENT:         Denies rhinorrhea, nasal congestion, and post nasal drip  Eyes:        Denies itchy watery eyes  Respiratory:   Negative for cough, shortness of breath and wheezing.   Cardiovascular:  Negative for chest pain and palpitations.  Gastrointestinal:        Denies heartburn and reflux medications  Genitourinary:  Negative for dysuria.  Skin:        Denies rashes or itching right now  Neurological:  Positive for headaches.    Physical Exam: BP 110/70 (BP Location: Left Arm, Patient Position: Sitting, Cuff Size: Normal)   Pulse 72   Temp 98.7 F (37.1 C) (Temporal)   Resp 18   Ht '5\' 10"'$  (1.778 m)   Wt 223 lb 6.4 oz (101.3 kg)   SpO2 95%   BMI 32.05 kg/m    Physical Exam Constitutional:      Appearance: Normal appearance.  HENT:     Head: Normocephalic and atraumatic.     Comments: Pharynx normal, eyes normal, ears normal, nose normal    Right Ear: Tympanic membrane, ear canal and external ear normal.     Left Ear: Tympanic membrane, ear canal and external ear normal.     Mouth/Throat:     Mouth: Mucous membranes are moist.     Pharynx: Oropharynx is clear.  Eyes:     Conjunctiva/sclera: Conjunctivae normal.  Cardiovascular:     Rate and Rhythm: Normal rate and regular rhythm.     Heart sounds: Normal heart sounds.  Pulmonary:     Effort: Pulmonary effort is normal.     Breath sounds: Normal breath sounds.     Comments: Lungs clear to auscultation Musculoskeletal:     Cervical back: Neck supple.  Skin:    General: Skin is warm.     Comments: Erythematous area noted on left forearm. He reports that this is left over from poison ivy. Flesh colored papule noted on left temple region.  No urticarial lesions or rash noted  Neurological:     Mental Status: He is alert and oriented to person, place, and time.  Psychiatric:        Mood and Affect: Mood normal.        Behavior: Behavior normal.        Thought Content: Thought content normal.        Judgment: Judgment normal.    Diagnostics:  none  Assessment and Plan: 1. Allergic reaction, subsequent encounter   2. Chronic urticaria    3. Allergy to alpha-gal   4. Bee sting reaction, accidental or unintentional, subsequent encounter     Meds ordered this encounter  Medications   EPINEPHrine (AUVI-Q) 0.3 mg/0.3 mL IJ SOAJ injection    Sig: Inject 0.3 mg into the muscle as needed for anaphylaxis.    Dispense:  2 each    Refill:  1     Patient Instructions  Alpha gal allergy Avoid all red meats, dairy products, and gelatin foods/medications. In case of an allergic reaction, give Benadryl 4 teaspoonfuls every 4 hours, and if life-threatening symptoms occur, inject with AuviQ 0.3 mg.  Stinging insect allergy Avoid stinging insects. In case of an allergic reaction, give Benadryl 4 teaspoonfuls every 4 hours, and if life-threatening symptoms occur, inject with AuviQ 0.3 mg  Allergic reaction-unknown trigger If your symptoms re-occur, begin a journal of events that occurred for up to 6 hours before your symptoms began including foods and beverages consumed, soaps or perfumes you had contact with, and medications.   In case of an allergic reaction, give Benadryl 82feaspoonfuls every 4 hours, and if life-threatening symptoms occur, inject with AuviQ 0.3 mg.  Chronic urticaria Lets see if avoiding all red meats, dairy, and gelatin foods/medications helps with your hives. All of your lab results are not back. We will call you once they are back. You may use Allegra 180 mg every morning You may use Zyrtec 10 mg at night May add on Pepcid if symptoms are not controlled Consider Xolair in the future if medications above are not helping  Stop triamcinolone 0.1% cream since you are wondering if it caused your hives. We can consider patch testing in the future Stop hydrocortisone 2.5% cream  since you are wondering if it caused your hives.   Please let [place] if this treatment plan is not working well for you. Schedule a follow up appointment in 4 weeks or sooner if needed    Return in about 4 weeks (around 02/10/2021), or  if symptoms worsen or fail to improve.    Thank you for the opportunity to care for this patient.  Please do not hesitate to contact me with questions.  CAlthea Charon FNP [facility]

## 2021-01-15 LAB — TRYPTASE: Tryptase: 3.6 ug/L (ref 2.2–13.2)

## 2021-01-15 LAB — ALPHA-GAL PANEL
Allergen Lamb IgE: 0.22 kU/L — AB
Beef IgE: 0.52 kU/L — AB
IgE (Immunoglobulin E), Serum: 663 IU/mL — ABNORMAL HIGH (ref 6–495)
O215-IgE Alpha-Gal: 1.67 kU/L — AB
Pork IgE: 0.31 kU/L — AB

## 2021-01-15 LAB — C-REACTIVE PROTEIN: CRP: <1 mg/L (ref 0–10)

## 2021-01-15 LAB — ALLERGEN STINGING INSECT PANEL
Honeybee IgE: 0.81 kU/L — AB
Hornet, White Face, IgE: 0.76 kU/L — AB
Hornet, Yellow, IgE: 0.82 kU/L — AB
Paper Wasp IgE: 2.03 kU/L — AB
Yellow Jacket, IgE: 1.04 kU/L — AB

## 2021-01-15 LAB — ANTINUCLEAR ANTIBODIES, IFA: ANA Titer 1: NEGATIVE

## 2021-01-15 LAB — SEDIMENTATION RATE: Sed Rate: 12 mm/hr (ref 0–15)

## 2021-01-15 LAB — CHRONIC URTICARIA: cu index: <3.6 (ref <10)

## 2021-02-08 ENCOUNTER — Encounter: Payer: Self-pay | Admitting: Allergy & Immunology

## 2021-02-08 ENCOUNTER — Ambulatory Visit (INDEPENDENT_AMBULATORY_CARE_PROVIDER_SITE_OTHER): Payer: 59 | Admitting: Allergy & Immunology

## 2021-02-08 ENCOUNTER — Other Ambulatory Visit: Payer: Self-pay

## 2021-02-08 VITALS — BP 122/82 | HR 66 | Temp 97.4°F | Resp 18

## 2021-02-08 DIAGNOSIS — T7840XD Allergy, unspecified, subsequent encounter: Secondary | ICD-10-CM

## 2021-02-08 DIAGNOSIS — L508 Other urticaria: Secondary | ICD-10-CM

## 2021-02-08 DIAGNOSIS — Z91018 Allergy to other foods: Secondary | ICD-10-CM

## 2021-02-08 NOTE — Progress Notes (Signed)
FOLLOW UP  Date of Service/Encounter:  02/09/21   Assessment:    Allergic reaction - with positive test to alpha gal  Chronic urticaria  Stinging insect anaphylaxis (honey bee, wasp, hornet, yellow jacket)  Plan/Recommendations:   Alpha gal allergy Avoid all red meats, dairy products, and gelatin foods/medications. In case of an allergic reaction, give Benadryl 4 teaspoonfuls every 4 hours, and if life-threatening symptoms occur, inject with AuviQ 0.3 mg.  Stinging insect allergy Avoid stinging insects. In case of an allergic reaction, give Benadryl 4 teaspoonfuls every 4 hours, and if life-threatening symptoms occur, inject with AuviQ 0.3 mg  Allergic reaction-unknown trigger If your symptoms re-occur, begin a journal of events that occurred for up to 6 hours before your symptoms began including foods and beverages consumed, soaps or perfumes you had contact with, and medications.   In case of an allergic reaction, give Benadryl 4 fteaspoonfuls every 4 hours, and if life-threatening symptoms occur, inject with AuviQ 0.3 mg.  Chronic urticaria I am cool with you playing with dairy containing foods and mammalian by products at home. Give [place] an update. We could add on Xolair to help decrease the likelihood of having a reaction (information provided).   Return in about 1 year (around 02/08/2022).    Subjective:   James Powers is a 45 y.o. male presenting today for follow up of  Chief Complaint  Patient presents with   Follow-up    James Powers has a history of the following: Patient Active Problem List   Diagnosis Date Noted   Chronic urticaria 12/02/2020   Allergic reaction 12/02/2020    History obtained from: chart review and patient.  James Powers is a 45 y.o. male presenting for a follow up visit.  He had labs during his first visit in August that demonstrated positives to alpha gal as well as beef, pork, and lamb.  Insect panel was positive to the entire panel as  well.  Since the last visit, he has done well. He reports that he is somewhat better. He has bumps that itchy but it is not to the same volume.  He ended up adopting a vegan diet. He was told to avoid dairy at the last visit and he has done this. He did get rid of this in his diet and he ended up going with vegan deodorant and vegan toothpaste. Over the weekend, he had a few spots in his beard. He has had 2-3 little spots but nothing like before.   He has gotten off of the ointments. He is not using them at all at this point. He is interested in introducing these foods back into his diet.   We discussed possibly introducing Xolair into his treatment plan. He is not really interested in introducing these into his treatment plan at all.   He has a few teenagers at home. One is starting school at [employer].   Otherwise, there have been no changes to his past medical history, surgical history, family history, or social history.    Review of Systems  Constitutional: Negative.  Negative for fever, malaise/fatigue and weight loss.  HENT: Negative.  Negative for congestion, ear discharge and ear pain.   Eyes:  Negative for pain, discharge and redness.  Respiratory:  Negative for cough, sputum production, shortness of breath and wheezing.   Cardiovascular: Negative.  Negative for chest pain and palpitations.  Gastrointestinal:  Negative for abdominal pain, constipation, diarrhea, heartburn, nausea and vomiting.  Skin:  Positive for rash.  Negative for itching.  Neurological:  Negative for dizziness and headaches.  Endo/Heme/Allergies:  Negative for environmental allergies. Does not bruise/bleed easily.      Objective:   Blood pressure 122/82, pulse 66, temperature (!) 97.4 F (36.3 C), temperature source Temporal, resp. rate 18, SpO2 97 %. There is no height or weight on file to calculate BMI.   Physical Exam:  Physical Exam Vitals reviewed.  Constitutional:      Appearance:  He is well-developed.     Comments: Talkative.   HENT:     Head: Normocephalic and atraumatic.     Right Ear: Tympanic membrane, ear canal and external ear normal. No drainage, swelling or tenderness. Tympanic membrane is not injected, scarred, erythematous, retracted or bulging.     Left Ear: Tympanic membrane, ear canal and external ear normal. No drainage, swelling or tenderness. Tympanic membrane is not injected, scarred, erythematous, retracted or bulging.     Nose: No nasal deformity, septal deviation, mucosal edema or rhinorrhea.     Right Turbinates: Enlarged and swollen.     Left Turbinates: Enlarged and swollen.     Right Sinus: No maxillary sinus tenderness or frontal sinus tenderness.     Left Sinus: No maxillary sinus tenderness or frontal sinus tenderness.     Mouth/Throat:     Mouth: Mucous membranes are not pale and not dry.     Pharynx: Uvula midline.  Eyes:     General: Lids are normal. Allergic shiner present.        Right eye: No discharge.        Left eye: No discharge.     Conjunctiva/sclera: Conjunctivae normal.     Right eye: Right conjunctiva is not injected. No chemosis.    Left eye: Left conjunctiva is not injected. No chemosis.    Pupils: Pupils are equal, round, and reactive to light.  Cardiovascular:     Rate and Rhythm: Normal rate and regular rhythm.     Heart sounds: Normal heart sounds.  Pulmonary:     Effort: Pulmonary effort is normal. No tachypnea, accessory muscle usage or respiratory distress.     Breath sounds: Normal breath sounds. No wheezing, rhonchi or rales.     Comments: Moving air well in all lung fields. No increased work of breathing noted.  Chest:     Chest wall: No tenderness.  Abdominal:     Tenderness: There is no abdominal tenderness. There is no guarding or rebound.  Lymphadenopathy:     Head:     Right side of head: No submandibular, tonsillar or occipital adenopathy.     Left side of head: No submandibular, tonsillar or  occipital adenopathy.     Cervical: No cervical adenopathy.  Skin:    General: Skin is warm.     Capillary Refill: Capillary refill takes less than 2 seconds.     Coloration: Skin is not pale.     Findings: No abrasion, erythema, petechiae or rash. Rash is not papular, urticarial or vesicular.     Comments: No eczematous or urticarial lesions noted.   Neurological:     Mental Status: He is alert.  Psychiatric:        Behavior: Behavior is cooperative.     Diagnostic studies: none     Salvatore Marvel, MD  Allergy and [place] [facility]

## 2021-02-08 NOTE — Patient Instructions (Signed)
Alpha gal allergy Avoid all red meats, dairy products, and gelatin foods/medications. In case of an allergic reaction, give Benadryl 4 teaspoonfuls every 4 hours, and if life-threatening symptoms occur, inject with AuviQ 0.3 mg.  Stinging insect allergy Avoid stinging insects. In case of an allergic reaction, give Benadryl 4 teaspoonfuls every 4 hours, and if life-threatening symptoms occur, inject with AuviQ 0.3 mg  Allergic reaction-unknown trigger If your symptoms re-occur, begin a journal of events that occurred for up to 6 hours before your symptoms began including foods and beverages consumed, soaps or perfumes you had contact with, and medications.   In case of an allergic reaction, give Benadryl 4 fteaspoonfuls every 4 hours, and if life-threatening symptoms occur, inject with AuviQ 0.3 mg.  Chronic urticaria I am cool with you playing with dairy containing foods and mammalian by products at home. Give [place] an update. We could add on Xolair to help decrease the likelihood of having a reaction (information provided).   Return in about 1 year (around 02/08/2022).    Please inform us of any Emergency Department visits, hospitalizations, or changes in symptoms. Call us before going to the ED for breathing or allergy symptoms since we might be able to fit you in for a sick visit. Feel free to contact us anytime with any questions, problems, or concerns.  It was a pleasure to see you again today!  Websites that have reliable patient information: 1. American Academy of Asthma, Allergy, and Immunology: www.aaaai.org 2. Food Allergy Research and Education (FARE): foodallergy.org 3. Mothers of Asthmatics: http://www.asthmacommunitynetwork.org 4. American College of Allergy, Asthma, and Immunology: www.acaai.org   COVID-19 Vaccine Information can be found at: ShippingScam.co.uk For questions related to vaccine distribution or  appointments, please email vaccine@[email or web address] .com or call [phone].   We realize that you might be concerned about having an allergic reaction to the COVID19 vaccines. To help with that concern, WE ARE OFFERING THE COVID19 VACCINES IN OUR OFFICE! Ask the front desk for dates!     "Like" [place] on Facebook and Instagram for our latest updates!      A healthy democracy works best when [employer] participate! Make sure you are registered to vote! If you have moved or changed any of your contact information, you will need to get this updated before voting!  In some cases, you MAY be able to register to vote online: [email or web address]

## 2021-02-09 ENCOUNTER — Encounter: Payer: Self-pay | Admitting: Allergy & Immunology

## 2021-05-30 DIAGNOSIS — M1712 Unilateral primary osteoarthritis, left knee: Secondary | ICD-10-CM | POA: Diagnosis not present

## 2021-05-30 DIAGNOSIS — M25362 Other instability, left knee: Secondary | ICD-10-CM | POA: Diagnosis not present

## 2021-05-31 ENCOUNTER — Other Ambulatory Visit: Payer: Self-pay | Admitting: Orthopedic Surgery

## 2021-05-31 DIAGNOSIS — M25362 Other instability, left knee: Secondary | ICD-10-CM

## 2021-05-31 DIAGNOSIS — G8929 Other chronic pain: Secondary | ICD-10-CM

## 2021-06-24 DIAGNOSIS — S6391XA Sprain of unspecified part of right wrist and hand, initial encounter: Secondary | ICD-10-CM | POA: Diagnosis not present

## 2021-06-26 ENCOUNTER — Ambulatory Visit
Admission: RE | Admit: 2021-06-26 | Discharge: 2021-06-26 | Disposition: A | Discharge status: Home / Self Care | Payer: 59 | Source: Ambulatory Visit | Attending: Orthopedic Surgery | Admitting: Orthopedic Surgery

## 2021-06-26 DIAGNOSIS — S83272A Complex tear of lateral meniscus, current injury, left knee, initial encounter: Secondary | ICD-10-CM | POA: Diagnosis not present

## 2021-06-26 DIAGNOSIS — M25362 Other instability, left knee: Secondary | ICD-10-CM

## 2021-06-26 DIAGNOSIS — G8929 Other chronic pain: Secondary | ICD-10-CM

## 2021-06-26 DIAGNOSIS — M7122 Synovial cyst of popliteal space [Baker], left knee: Secondary | ICD-10-CM | POA: Diagnosis not present

## 2021-06-26 DIAGNOSIS — M25462 Effusion, left knee: Secondary | ICD-10-CM | POA: Diagnosis not present

## 2021-06-26 DIAGNOSIS — M25562 Pain in left knee: Secondary | ICD-10-CM

## 2021-06-27 DIAGNOSIS — M1712 Unilateral primary osteoarthritis, left knee: Secondary | ICD-10-CM | POA: Diagnosis not present

## 2021-07-07 NOTE — Progress Notes (Signed)
Surgery orders requested via Epic inbox. °

## 2021-07-13 NOTE — Patient Instructions (Addendum)
DUE TO COVID-19 ONLY ONE VISITOR IS ALLOWED TO COME WITH YOU AND STAY IN THE WAITING ROOM ONLY DURING PRE OP AND PROCEDURE.   **NO VISITORS ARE ALLOWED IN THE SHORT STAY AREA OR RECOVERY ROOM!!**   You are not required to quarantine, however you are required to wear a well-fitted mask when you are out and around people not in your household.  Hand Hygiene often Do NOT share personal items Notify your provider if you are in close contact with someone who has COVID or you develop fever 100.4 or greater, new onset of sneezing, cough, sore throat, shortness of breath or body aches.   Your procedure is scheduled on: Wednesday, 07-26-21   Report to [facility] Main Entrance    Report to admitting at 12:20 PM   Call this number if you have problems the morning of surgery [phone]   Do not eat food :After Midnight.   After Midnight you may have the following liquids until 11:30 AM DAY OF SURGERY  Water Black Coffee (sugar ok, NO MILK/CREAM OR CREAMERS)  Tea (sugar ok, NO MILK/CREAM OR CREAMERS) regular and decaf                             Plain Jell-O (NO RED)                                           Fruit ices (not with fruit pulp, NO RED)                                     Popsicles (NO RED)                                                                  Juice: apple, WHITE grape, WHITE cranberry Sports drinks like Gatorade (NO RED) Clear broth(vegetable,chicken,beef)                 Oral Hygiene is also important to reduce your risk of infection.                                    Remember - BRUSH YOUR TEETH THE MORNING OF SURGERY WITH YOUR REGULAR TOOTHPASTE   Do NOT smoke after Midnight   Take these medicines the morning of surgery with A SIP OF WATER:  None                          You may not have any metal on your body including  jewelry, and body piercing             Do not wear lotions, powders, cologne, or deodorant              Men may shave face and  neck.   Do not bring valuables to the hospital. [place].   Contacts, dentures or bridgework may not be  worn into surgery.   Bring small overnight bag day of surgery.    Patients discharged on the day of surgery will not be allowed to drive home.  Someone NEEDS to stay with you for the first 24 hours after anesthesia.  Special Instructions: Bring a copy of your healthcare power of attorney and living will documents the day of surgery if you haven't scanned them before.  Please read over the following fact sheets you were given: IF YOU HAVE QUESTIONS ABOUT YOUR PRE-OP INSTRUCTIONS PLEASE CALL [facility] - Preparing for Surgery Before surgery, you can play an important role.  Because skin is not sterile, your skin needs to be as free of germs as possible.  You can reduce the number of germs on your skin by washing with CHG (chlorahexidine gluconate) soap before surgery.  CHG is an antiseptic cleaner which kills germs and bonds with the skin to continue killing germs even after washing. Please DO NOT use if you have an allergy to CHG or antibacterial soaps.  If your skin becomes reddened/irritated stop using the CHG and inform your nurse when you arrive at Short Stay. Do not shave (including legs and underarms) for at least 48 hours prior to the first CHG shower.  You may shave your face/neck.  Please follow these instructions carefully:  1.  Shower with CHG Soap the night before surgery and the  morning of surgery.  2.  If you choose to wash your hair, wash your hair first as usual with your normal  shampoo.  3.  After you shampoo, rinse your hair and body thoroughly to remove the shampoo.                             4.  Use CHG as you would any other liquid soap.  You can apply chg directly to the skin and wash.  Gently with a scrungie or clean washcloth.  5.  Apply the CHG Soap to your body ONLY FROM THE NECK DOWN.   Do   not use on  face/ open                           Wound or open sores. Avoid contact with eyes, ears mouth and   genitals (private parts).                       Wash face,  Genitals (private parts) with your normal soap.             6.  Wash thoroughly, paying special attention to the area where your    surgery  will be performed.  7.  Thoroughly rinse your body with warm water from the neck down.  8.  DO NOT shower/wash with your normal soap after using and rinsing off the CHG Soap.                9.  Pat yourself dry with a clean towel.            10.  Wear clean pajamas.            11.  Place clean sheets on your bed the night of your first shower and do not  sleep with pets. Day of Surgery : Do not apply any lotions/deodorants the morning of surgery.  Please wear clean clothes to the hospital/surgery center.  FAILURE TO FOLLOW THESE INSTRUCTIONS MAY RESULT IN THE CANCELLATION OF YOUR SURGERY  PATIENT SIGNATURE_________________________________  NURSE SIGNATURE__________________________________  ________________________________________________________________________

## 2021-07-13 NOTE — Progress Notes (Addendum)
COVID swab appointment: N/A  COVID Vaccine Completed:  Yes x2 Date COVID Vaccine completed: Has received booster: COVID vaccine manufacturer: Pfizer      Date of COVID positive in last 90 days:  No  PCP - Allyn Kenner, MD Cardiologist -  Jenkins Rouge, MD   Chest x-ray - N/A EKG - 12-14-19 Epic Stress Test - 12-14-19 Epic ECHO - N/A Cardiac Cath - N/A Pacemaker/ICD device last checked: Spinal Cord Stimulator:  Bowel Prep - N/A  Sleep Study - N/A CPAP -   Fasting Blood Sugar - N/A Checks Blood Sugar _____ times a day  Blood Thinner Instructions:N/A Aspirin Instructions: Last Dose:  Activity level:   Can go up a flight of stairs and perform activities of daily living without stopping and without symptoms of chest pain or shortness of breath.  Able to exercise without symptoms  Anesthesia review: Evaluated by cardiology for dizziness, chest discomfort.  Determined to be stress and no further follow up needed. .  Patient denies shortness of breath, fever, cough and chest pain at PAT appointment  Patient verbalized understanding of instructions that were given to them at the PAT appointment. Patient was also instructed that they will need to review over the PAT instructions again at home before surgery.

## 2021-07-14 ENCOUNTER — Other Ambulatory Visit: Payer: Self-pay

## 2021-07-14 ENCOUNTER — Encounter (HOSPITAL_COMMUNITY)
Admission: RE | Admit: 2021-07-14 | Discharge: 2021-07-14 | Disposition: A | Discharge status: Home / Self Care | Payer: 59 | Source: Ambulatory Visit | Attending: Orthopedic Surgery | Admitting: Orthopedic Surgery

## 2021-07-14 ENCOUNTER — Encounter (HOSPITAL_COMMUNITY): Payer: Self-pay

## 2021-07-14 DIAGNOSIS — Z01818 Encounter for other preprocedural examination: Secondary | ICD-10-CM | POA: Diagnosis not present

## 2021-07-14 HISTORY — DX: Other complications of anesthesia, initial encounter: T88.59XA

## 2021-07-14 HISTORY — DX: Unspecified osteoarthritis, unspecified site: M19.90

## 2021-07-14 HISTORY — DX: Migraine, unspecified, not intractable, without status migrainosus: G43.909

## 2021-07-14 HISTORY — DX: Gastro-esophageal reflux disease without esophagitis: K21.9

## 2021-07-17 ENCOUNTER — Other Ambulatory Visit: Payer: Self-pay | Admitting: Orthopedic Surgery

## 2021-07-26 ENCOUNTER — Ambulatory Visit (HOSPITAL_BASED_OUTPATIENT_CLINIC_OR_DEPARTMENT_OTHER): Payer: 59 | Admitting: Physician Assistant

## 2021-07-26 ENCOUNTER — Encounter (HOSPITAL_COMMUNITY)
Admission: RE | Disposition: A | Discharge status: Home / Self Care | Payer: Self-pay | Source: Ambulatory Visit | Attending: Orthopedic Surgery

## 2021-07-26 ENCOUNTER — Ambulatory Visit (HOSPITAL_COMMUNITY): Payer: 59 | Admitting: Physician Assistant

## 2021-07-26 ENCOUNTER — Ambulatory Visit (HOSPITAL_COMMUNITY)
Admission: RE | Admit: 2021-07-26 | Discharge: 2021-07-26 | Disposition: A | Discharge status: Home / Self Care | Payer: 59 | Source: Ambulatory Visit | Attending: Orthopedic Surgery | Admitting: Orthopedic Surgery

## 2021-07-26 ENCOUNTER — Other Ambulatory Visit: Payer: Self-pay

## 2021-07-26 ENCOUNTER — Encounter (HOSPITAL_COMMUNITY): Payer: Self-pay | Admitting: Orthopedic Surgery

## 2021-07-26 DIAGNOSIS — S83282A Other tear of lateral meniscus, current injury, left knee, initial encounter: Secondary | ICD-10-CM | POA: Diagnosis not present

## 2021-07-26 DIAGNOSIS — K219 Gastro-esophageal reflux disease without esophagitis: Secondary | ICD-10-CM | POA: Insufficient documentation

## 2021-07-26 DIAGNOSIS — M1712 Unilateral primary osteoarthritis, left knee: Secondary | ICD-10-CM

## 2021-07-26 DIAGNOSIS — M94262 Chondromalacia, left knee: Secondary | ICD-10-CM | POA: Insufficient documentation

## 2021-07-26 DIAGNOSIS — M6752 Plica syndrome, left knee: Secondary | ICD-10-CM | POA: Diagnosis not present

## 2021-07-26 HISTORY — PX: KNEE ARTHROSCOPY WITH MEDIAL MENISECTOMY: SHX5651

## 2021-07-26 SURGERY — ARTHROSCOPY, KNEE, WITH MEDIAL MENISCECTOMY
Anesthesia: General | Site: Knee | Laterality: Left

## 2021-07-26 MED ORDER — ONDANSETRON HCL 4 MG/2ML IJ SOLN
INTRAMUSCULAR | Status: DC | PRN
  Administered 2021-07-26: 4 mg via INTRAVENOUS

## 2021-07-26 MED ORDER — FENTANYL CITRATE (PF) 100 MCG/2ML IJ SOLN
INTRAMUSCULAR | Status: AC
  Filled 2021-07-26: qty 2

## 2021-07-26 MED ORDER — ONDANSETRON HCL 4 MG/2ML IJ SOLN
INTRAMUSCULAR | Status: AC
  Filled 2021-07-26: qty 2

## 2021-07-26 MED ORDER — HYDROCODONE-ACETAMINOPHEN 5-325 MG PO TABS: 1.0000 | ORAL_TABLET | ORAL | 0 refills | Status: DC | PRN

## 2021-07-26 MED ORDER — ORAL CARE MOUTH RINSE: 15.0000 mL | Freq: Once | OROMUCOSAL | Status: AC

## 2021-07-26 MED ORDER — FENTANYL CITRATE PF 50 MCG/ML IJ SOSY
PREFILLED_SYRINGE | INTRAMUSCULAR | Status: AC
  Filled 2021-07-26: qty 1

## 2021-07-26 MED ORDER — FENTANYL CITRATE (PF) 100 MCG/2ML IJ SOLN
INTRAMUSCULAR | Status: DC | PRN
  Administered 2021-07-26 (×2): 25 ug via INTRAVENOUS

## 2021-07-26 MED ORDER — DEXAMETHASONE SODIUM PHOSPHATE 10 MG/ML IJ SOLN
INTRAMUSCULAR | Status: AC
  Filled 2021-07-26: qty 1

## 2021-07-26 MED ORDER — IBUPROFEN 600 MG PO TABS
600.0000 mg | ORAL_TABLET | Freq: Four times a day (QID) | ORAL | 0 refills | Status: DC | PRN
Start: 2021-07-26 — End: 2021-09-13

## 2021-07-26 MED ORDER — PROPOFOL 10 MG/ML IV BOLUS
INTRAVENOUS | Status: DC | PRN
Start: 2021-07-26 — End: 2021-07-26
  Administered 2021-07-26: 230 mg via INTRAVENOUS
  Administered 2021-07-26: 30 mg via INTRAVENOUS

## 2021-07-26 MED ORDER — LACTATED RINGERS IV SOLN: INTRAVENOUS | Status: DC

## 2021-07-26 MED ORDER — DIPHENHYDRAMINE HCL 50 MG/ML IJ SOLN
INTRAMUSCULAR | Status: DC | PRN
  Administered 2021-07-26: 12.5 mg via INTRAVENOUS

## 2021-07-26 MED ORDER — DEXAMETHASONE SODIUM PHOSPHATE 10 MG/ML IJ SOLN
INTRAMUSCULAR | Status: DC | PRN
  Administered 2021-07-26: 4 mg via INTRAVENOUS

## 2021-07-26 MED ORDER — PROPOFOL 10 MG/ML IV BOLUS
INTRAVENOUS | Status: AC
  Filled 2021-07-26: qty 20

## 2021-07-26 MED ORDER — DIPHENHYDRAMINE HCL 50 MG/ML IJ SOLN
INTRAMUSCULAR | Status: AC
  Filled 2021-07-26: qty 1

## 2021-07-26 MED ORDER — CHLORHEXIDINE GLUCONATE 0.12 % MT SOLN
15.0000 mL | Freq: Once | OROMUCOSAL | Status: AC
  Administered 2021-07-26: 15 mL via OROMUCOSAL

## 2021-07-26 MED ORDER — LIDOCAINE 2% (20 MG/ML) 5 ML SYRINGE
INTRAMUSCULAR | Status: DC | PRN
  Administered 2021-07-26: 100 mg via INTRAVENOUS

## 2021-07-26 MED ORDER — MIDAZOLAM HCL 5 MG/5ML IJ SOLN
INTRAMUSCULAR | Status: DC | PRN
Start: 2021-07-26 — End: 2021-07-26
  Administered 2021-07-26: 2 mg via INTRAVENOUS

## 2021-07-26 MED ORDER — MIDAZOLAM HCL 2 MG/2ML IJ SOLN
INTRAMUSCULAR | Status: AC
  Filled 2021-07-26: qty 2

## 2021-07-26 MED ORDER — FENTANYL CITRATE PF 50 MCG/ML IJ SOSY
25.0000 ug | PREFILLED_SYRINGE | INTRAMUSCULAR | Status: DC | PRN
  Administered 2021-07-26: 50 ug via INTRAVENOUS

## 2021-07-26 SURGICAL SUPPLY — 28 items
BAG COUNTER SPONGE SURGICOUNT (BAG) IMPLANT
BAG SPNG CNTER NS LX DISP (BAG)
BNDG ELASTIC 6X5.8 VLCR STR LF (GAUZE/BANDAGES/DRESSINGS) ×1 IMPLANT
COVER SURGICAL LIGHT HANDLE (MISCELLANEOUS) ×3 IMPLANT
DISSECTOR 4.0MM X 13CM (MISCELLANEOUS) ×3 IMPLANT
DRAPE U-SHAPE 47X51 STRL (DRAPES) ×3 IMPLANT
DRSG PAD ABDOMINAL 8X10 ST (GAUZE/BANDAGES/DRESSINGS) ×6 IMPLANT
DURAPREP 26ML APPLICATOR (WOUND CARE) ×3 IMPLANT
GAUZE 4X4 16PLY ~~LOC~~+RFID DBL (SPONGE) ×3 IMPLANT
GAUZE SPONGE 4X4 12PLY STRL (GAUZE/BANDAGES/DRESSINGS) ×3 IMPLANT
GAUZE XEROFORM 1X8 LF (GAUZE/BANDAGES/DRESSINGS) ×3 IMPLANT
GLOVE SURG ENC TEXT LTX SZ7.5 (GLOVE) ×3 IMPLANT
GLOVE SURG ORTHO LTX SZ7.5 (GLOVE) ×3 IMPLANT
GLOVE SURG ORTHO LTX SZ8 (GLOVE) ×6 IMPLANT
GLOVE SURG UNDER POLY LF SZ7.5 (GLOVE) ×3 IMPLANT
GLOVE SURG UNDER POLY LF SZ8.5 (GLOVE) ×6 IMPLANT
GOWN STRL REUS W/ TWL XL LVL3 (GOWN DISPOSABLE) ×4 IMPLANT
GOWN STRL REUS W/TWL XL LVL3 (GOWN DISPOSABLE) ×4
KIT BASIN OR (CUSTOM PROCEDURE TRAY) ×3 IMPLANT
MANIFOLD NEPTUNE II (INSTRUMENTS) ×6 IMPLANT
PACK ARTHROSCOPY WL (CUSTOM PROCEDURE TRAY) ×3 IMPLANT
PADDING CAST COTTON 6X4 STRL (CAST SUPPLIES) ×3 IMPLANT
PENCIL SMOKE EVACUATOR (MISCELLANEOUS) IMPLANT
PROBE BIPOLAR ATHRO 135MM 90D (MISCELLANEOUS) IMPLANT
PROTECTOR NERVE ULNAR (MISCELLANEOUS) ×3 IMPLANT
SUT ETHILON 4 0 PS 2 18 (SUTURE) ×3 IMPLANT
TOWEL OR 17X26 10 PK STRL BLUE (TOWEL DISPOSABLE) ×3 IMPLANT
TUBING ARTHROSCOPY IRRIG 16FT (MISCELLANEOUS) ×3 IMPLANT

## 2021-07-26 NOTE — H&P (Signed)
Denver Faster ?MRN:  [id number] ?DOB/SEX:  Apr 08, 1976/male ? ?CHIEF COMPLAINT:  Painful left Knee ? ?HISTORY: ?Patient is a 46 y.o. male presented with a history of pain in the left knee. Onset of symptoms was abrupt starting a few months ago with gradually worsening course since that time. Patient has been treated conservatively with over-the-counter NSAIDs and activity modification. Patient currently rates pain in the knee at 10 out of 10 with activity. There is pain at night. ? ?PAST MEDICAL HISTORY: ?Patient Active Problem List  ? Diagnosis Date Noted  ? Chronic urticaria 12/02/2020  ? Allergic reaction 12/02/2020  ? ?Past Medical History:  ?Diagnosis Date  ? Arthritis   ? Complication of anesthesia   ? Does not numb well and woke up during surgery  ? Dizziness and giddiness   ? GERD (gastroesophageal reflux disease)   ? Migraine headache   ? Screening for cardiovascular condition   ? ?Past Surgical History:  ?Procedure Laterality Date  ? KNEE SURGERY Left   ? REFRACTIVE SURGERY Bilateral   ? SHOULDER SURGERY Left   ? WISDOM TOOTH EXTRACTION    ?  ? ?MEDICATIONS:   ?Medications Prior to Admission  ?Medication Sig Dispense Refill Last Dose  ? EPINEPHrine (AUVI-Q) 0.3 mg/0.3 mL IJ SOAJ injection Inject 0.3 mg into the muscle as needed for anaphylaxis. 2 each 1   ? ? ?ALLERGIES:   ?Allergies  ?Allergen Reactions  ? Alpha-Gal Itching and Rash  ?  Has reaction after too much dairy  ? ? ?REVIEW OF SYSTEMS:  ?A comprehensive review of systems was negative except for: Musculoskeletal: positive for arthralgias and bone pain ? ? ?FAMILY HISTORY:  No family history on file. ? ?SOCIAL HISTORY:   ?Social History  ? ?Tobacco Use  ? Smoking status: Never  ? Smokeless tobacco: Never  ?Substance Use Topics  ? Alcohol use: Never  ? ?  ?EXAMINATION: ? ?Vital signs in last 24 hours: ?  ? ?There were no vitals taken for this visit. ? ?General Appearance:    Alert, cooperative, no distress, appears stated age  ?Head:    Normocephalic,  without obvious abnormality, atraumatic  ?Eyes:    PERRL, conjunctiva/corneas clear, EOM's intact, fundi  ?  benign, both eyes       ?Ears:    Normal TM's and external ear canals, both ears  ?Nose:   Nares normal, septum midline, mucosa normal, no drainage    or sinus tenderness  ?Throat:   Lips, mucosa, and tongue normal; teeth and gums normal  ?Neck:   Supple, symmetrical, trachea midline, no adenopathy;     ?  thyroid:  No enlargement/tenderness/nodules; no carotid ?  bruit or JVD  ?Back:     Symmetric, no curvature, ROM normal, no CVA tenderness  ?Lungs:     Clear to auscultation bilaterally, respirations unlabored  ?Chest wall:    No tenderness or deformity  ?Heart:    Regular rate and rhythm, S1 and S2 normal, no murmur, rub   or gallop  ?Abdomen:     Soft, non-tender, bowel sounds active all four quadrants,  ?  no masses, no organomegaly  ?Genitalia:    Normal male without lesion, discharge or tenderness  ?Rectal:    Normal tone, normal prostate, no masses or tenderness; ?  guaiac negative stool  ?Extremities:   Extremities normal, atraumatic, no cyanosis or edema  ?Pulses:   2+ and symmetric all extremities  ?Skin:   Skin color, texture, turgor normal, no rashes or  lesions  ?Lymph nodes:   Cervical, supraclavicular, and axillary nodes normal  ?Neurologic:   CNII-XII intact. Normal strength, sensation and reflexes    ?  throughout  ? ? ?Musculoskeletal:  ROM 0-120, Ligaments intact, positive mcmurrays  ?Imaging Review ?Plain radiographs demonstrate mild degenerative joint disease of the left knee. The overall alignment is neutral. The bone quality appears to be good for age and reported activity level. ? ?MRI evidence of medial meniscus tear ? ?Assessment/Plan: ?Internal Derangement, left knee  ? ?The patient history, physical examination and imaging studies are consistent with mild degenerative joint disease of the left knee. MRI evidence of internal derangement. The patient has failed conservative  treatment.  The clearance notes were reviewed.  After discussion with the patient it was felt that knee arthroscopy was indicated. The procedure,  risks, and benefits were presented and reviewed. The patient acknowledged the explanation, agreed to proceed with the plan. ? ? ? ? ? ?Donia Ast ?07/26/2021, 12:42 PM  ?

## 2021-07-26 NOTE — Anesthesia Procedure Notes (Signed)
Procedure Name: LMA Insertion ?Date/Time: 07/26/2021 3:40 PM ?Performed by: Milford Cage, CRNA ?Pre-anesthesia Checklist: Patient identified, Emergency Drugs available, Suction available and Patient being monitored ?Patient Re-evaluated:Patient Re-evaluated prior to induction ?Oxygen Delivery Method: Circle system utilized ?Preoxygenation: Pre-oxygenation with 100% oxygen ?Induction Type: IV induction ?Ventilation: Mask ventilation without difficulty ?LMA: LMA inserted ?LMA Size: 4.0 ?Number of attempts: 1 ?Tube secured with: Tape ?Dental Injury: Teeth and Oropharynx as per pre-operative assessment  ? ? ? ? ?

## 2021-07-26 NOTE — Anesthesia Postprocedure Evaluation (Signed)
Anesthesia Post Note ? ?Patient: Kien Mirsky ? ?Procedure(s) Performed: KNEE ARTHROSCOPY WITH MEDIAL MENISECTOMY (Left: Knee) ? ?  ? ?Patient location during evaluation: PACU ?Anesthesia Type: General ?Level of consciousness: awake and alert, patient cooperative and oriented ?Pain management: pain level controlled ?Vital Signs Assessment: post-procedure vital signs reviewed and stable ?Respiratory status: spontaneous breathing, nonlabored ventilation and respiratory function stable ?Cardiovascular status: blood pressure returned to baseline and stable ?Postop Assessment: no apparent nausea or vomiting and adequate PO intake ?Anesthetic complications: no ? ? ?No notable events documented. ? ?Last Vitals:  ?Vitals:  ? 07/26/21 1700 07/26/21 1721  ?BP: 129/86 128/85  ?Pulse: 72 75  ?Resp: 12 14  ?Temp:    ?SpO2: 96% 99%  ?  ?Last Pain:  ?Vitals:  ? 07/26/21 1700  ?TempSrc:   ?PainSc: 7   ? ? ?  ?  ?  ?  ?  ?  ? ?Hadja Harral,E. Mileigh Tilley ? ? ? ? ?

## 2021-07-26 NOTE — Op Note (Signed)
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 ?Postop diagnosis: Same. ? ?Indication for procedure: ? ?The patient is a 46 year old mechanical symptoms couple remote injuries and 1 surgery.  He has mechanical symptoms MRI evidence of a lateral meniscus tear as well as some radiographic evidence of osteoarthritis prematurely. ? ?Description of procedure: ? ?Patient taken to the operating room administered general anesthesia.  The left leg was prepped and draped in usual fashion.  Inferolateral inferomedial portals were created with a #11 blade blunt trocar and cannula.  Diagnostic arthroscopy the patellofemoral joint revealed very minimal chondromalacia and a small medial plica which was resected.  There was into the notch the ACL and PCL really just scar tissue not a lot of definition other than the anterior bundle.  The medial compartment had an isolated 1 x 1 cm grade IV chondromalacia lesion.  I cleaned that out performed a microfracture with a 30 degree microfracture awl.  The medial meniscus was normal as was the tibial plateau.  I then went into the figure-of-four position.  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  Resected at least 50% of the meniscus removing lots of loose unstable pieces.  I then completed the chondroplasty in the lateral compartment retorted all 3 compartments to ensure there is no further need for chondroplasty and all debris had been removed.  Then closed with 4 nylon sutures dressed with Xeroform dry sponges sterile Webril and Ace wrap  ? ?complications: None ? ?EBL: None. ? ?Patient was taken to the recovery room in stable condition. ? ?Vickey Huger, MD ?

## 2021-07-26 NOTE — Anesthesia Preprocedure Evaluation (Addendum)
Anesthesia Evaluation  ?Patient identified by MRN, date of birth, ID band ?Patient awake ? ? ? ?Reviewed: ?Allergy & Precautions, NPO status , Patient's Chart, lab work & pertinent test results ? ?Airway ?Mallampati: II ? ?TM Distance: >3 FB ?Neck ROM: Full ? ? ? Dental ?no notable dental hx. ? ?  ?Pulmonary ?neg pulmonary ROS,  ?  ?Pulmonary exam normal ? ? ? ? ? ? ? Cardiovascular ?negative cardio ROS ? ? ?Rhythm:Regular Rate:Normal ? ? ?  ?Neuro/Psych ? Headaches, negative psych ROS  ? GI/Hepatic ?Neg liver ROS, GERD  ,  ?Endo/Other  ?negative endocrine ROS ? Renal/GU ?negative Renal ROS  ?negative genitourinary ?  ?Musculoskeletal ? ?(+) Arthritis ,  ? Abdominal ?Normal abdominal exam  (+)   ?Peds ? Hematology ?negative hematology ROS ?(+)   ?Anesthesia Other Findings ? ? Reproductive/Obstetrics ? ?  ? ? ? ? ? ? ? ? ? ? ? ? ? ?  ?  ? ? ? ? ? ? ? ?Anesthesia Physical ?Anesthesia Plan ? ?ASA: 2 ? ?Anesthesia Plan: General  ? ?Post-op Pain Management:   ? ?Induction: Intravenous ? ?PONV Risk Score and Plan: 2 and Ondansetron, Dexamethasone, Midazolam and Treatment may vary due to age or medical condition ? ?Airway Management Planned: Mask and LMA ? ?Additional Equipment: None ? ?Intra-op Plan:  ? ?Post-operative Plan: Extubation in OR ? ?Informed Consent: I have reviewed the patients History and Physical, chart, labs and discussed the procedure including the risks, benefits and alternatives for the proposed anesthesia with the patient or authorized representative who has indicated his/her understanding and acceptance.  ? ? ? ?Dental advisory given ? ?Plan Discussed with: CRNA ? ?Anesthesia Plan Comments:   ? ? ? ? ? ? ?Anesthesia Quick Evaluation ? ?

## 2021-07-26 NOTE — Transfer of Care (Signed)
Immediate Anesthesia Transfer of Care Note ? ?Patient: James Powers ? ?Procedure(s) Performed: KNEE ARTHROSCOPY WITH MEDIAL MENISECTOMY (Left: Knee) ? ?Patient Location: PACU ? ?Anesthesia Type:General ? ?Level of Consciousness: drowsy ? ?Airway & Oxygen Therapy: Patient Spontanous Breathing and Patient connected to face mask oxygen ? ?Post-op Assessment: Report given to RN and Post -op Vital signs reviewed and stable ? ?Post vital signs: Reviewed and stable ? ?Last Vitals:  ?Vitals Value Taken Time  ?BP 114/68 07/26/21 1626  ?Temp    ?Pulse 70 07/26/21 1629  ?Resp 12 07/26/21 1629  ?SpO2 93 % 07/26/21 1629  ?Vitals shown include unvalidated device data. ? ?Last Pain:  ?Vitals:  ? 07/26/21 1337  ?TempSrc:   ?PainSc: 0-No pain  ?   ? ?Patients Stated Pain Goal: 3 (07/26/21 1337) ? ?Complications: No notable events documented. ?

## 2021-07-27 ENCOUNTER — Encounter (HOSPITAL_COMMUNITY): Payer: Self-pay | Admitting: Orthopedic Surgery

## 2021-08-01 DIAGNOSIS — Z9889 Other specified postprocedural states: Secondary | ICD-10-CM | POA: Diagnosis not present

## 2021-08-01 DIAGNOSIS — M25462 Effusion, left knee: Secondary | ICD-10-CM | POA: Diagnosis not present

## 2021-08-01 DIAGNOSIS — M25662 Stiffness of left knee, not elsewhere classified: Secondary | ICD-10-CM | POA: Diagnosis not present

## 2021-08-08 DIAGNOSIS — M25462 Effusion, left knee: Secondary | ICD-10-CM | POA: Diagnosis not present

## 2021-08-08 DIAGNOSIS — Z9889 Other specified postprocedural states: Secondary | ICD-10-CM | POA: Diagnosis not present

## 2021-08-08 DIAGNOSIS — M25662 Stiffness of left knee, not elsewhere classified: Secondary | ICD-10-CM | POA: Diagnosis not present

## 2021-08-15 DIAGNOSIS — M25662 Stiffness of left knee, not elsewhere classified: Secondary | ICD-10-CM | POA: Diagnosis not present

## 2021-09-13 ENCOUNTER — Encounter (HOSPITAL_COMMUNITY): Payer: Self-pay

## 2021-09-13 ENCOUNTER — Ambulatory Visit (HOSPITAL_COMMUNITY)
Admission: RE | Admit: 2021-09-13 | Discharge: 2021-09-13 | Disposition: A | Discharge status: Home / Self Care | Payer: 59 | Source: Ambulatory Visit | Attending: Internal Medicine | Admitting: Internal Medicine

## 2021-09-13 ENCOUNTER — Ambulatory Visit (INDEPENDENT_AMBULATORY_CARE_PROVIDER_SITE_OTHER): Payer: 59

## 2021-09-13 VITALS — BP 114/74 | HR 65 | Temp 98.2°F | Resp 18

## 2021-09-13 DIAGNOSIS — M79644 Pain in right finger(s): Secondary | ICD-10-CM | POA: Diagnosis not present

## 2021-09-13 DIAGNOSIS — M7989 Other specified soft tissue disorders: Secondary | ICD-10-CM

## 2021-09-13 DIAGNOSIS — S6991XA Unspecified injury of right wrist, hand and finger(s), initial encounter: Secondary | ICD-10-CM

## 2021-09-13 MED ORDER — IBUPROFEN 600 MG PO TABS
600.0000 mg | ORAL_TABLET | Freq: Three times a day (TID) | ORAL | 0 refills | Status: DC | PRN
Start: 2021-09-13 — End: 2022-02-02

## 2021-09-13 NOTE — ED Triage Notes (Addendum)
Pt states last pm he injured his R 5th digit with deformity. Pt put finger back in place. Today swelling decreased movement. ?

## 2021-09-13 NOTE — ED Provider Notes (Signed)
?[facility] ? ? ? ?CSN: [id number] ?Arrival date & time: 09/13/21  1505 ? ? ?  ? ?History   ?Chief Complaint ?Chief Complaint  ?Patient presents with  ? Finger Injury  ?  Entered by patient  ? ? ?HPI ?James Powers is a 46 y.o. male.  ? ?Patient presents today with a 1 day history of right fifth digit pain.  Reports that he was sparring with his daughter in martial arts when she kicked his finger causing deformity.  He then strained his finger on his own and has had ongoing pain since that time.  He reports pain is minimal at rest rated 2/3 but increases significantly with attempted flexion or palpation.  He has not been taking any over-the-counter medications.  He does report occasional numbness particularly when he had this in the splint overnight but denies ongoing numbness or paresthesias.  Denies previous injury or surgery involving right hand. ? ? ?Past Medical History:  ?Diagnosis Date  ? Arthritis   ? Complication of anesthesia   ? Does not numb well and woke up during surgery  ? Dizziness and giddiness   ? GERD (gastroesophageal reflux disease)   ? Migraine headache   ? Screening for cardiovascular condition   ? ? ?Patient Active Problem List  ? Diagnosis Date Noted  ? Chronic urticaria 12/02/2020  ? Allergic reaction 12/02/2020  ? ? ?Past Surgical History:  ?Procedure Laterality Date  ? KNEE ARTHROSCOPY WITH MEDIAL MENISECTOMY Left 07/26/2021  ? Procedure: KNEE ARTHROSCOPY WITH MEDIAL MENISECTOMY;  Surgeon: Vickey Huger, MD;  Location: [facility];  Service: Orthopedics;  Laterality: Left;  ? KNEE SURGERY Left   ? REFRACTIVE SURGERY Bilateral   ? SHOULDER SURGERY Left   ? WISDOM TOOTH EXTRACTION    ? ? ? ? ? ?Home Medications   ? ?Prior to Admission medications   ?Medication Sig Start Date End Date Taking? Authorizing Provider  ?EPINEPHrine (AUVI-Q) 0.3 mg/0.3 mL IJ SOAJ injection Inject 0.3 mg into the muscle as needed for anaphylaxis. 01/13/21   Valentina Shaggy, MD  ?HYDROcodone-acetaminophen  (NORCO/VICODIN) 5-325 MG tablet Take 1 tablet by mouth every 4 (four) hours as needed for moderate pain. 07/26/21 07/26/22  Donia Ast, PA  ?ibuprofen (ADVIL) 600 MG tablet Take 1 tablet (600 mg total) by mouth every 8 (eight) hours as needed. 09/13/21   Eydie Wormley, Derry Skill, PA-C  ? ? ?Family History ?History reviewed. No pertinent family history. ? ?Social History ?Social History  ? ?Tobacco Use  ? Smoking status: Never  ? Smokeless tobacco: Never  ?Vaping Use  ? Vaping Use: Never used  ?Substance Use Topics  ? Alcohol use: Never  ? Drug use: Never  ? ? ? ?Allergies   ?Oxycodone and Alpha-gal ? ? ?Review of Systems ?Review of Systems  ?Constitutional:  Positive for activity change. Negative for appetite change, fatigue and fever.  ?Musculoskeletal:  Positive for arthralgias and joint swelling.  ?Skin:  Negative for color change.  ?Neurological:  Positive for numbness. Negative for dizziness, weakness, light-headedness and headaches.  ? ? ?Physical Exam ?Triage Vital Signs ?ED Triage Vitals [09/13/21 1527]  ?Enc Vitals Group  ?   BP 114/74  ?   Pulse Rate 65  ?   Resp 18  ?   Temp 98.2 ?F (36.8 ?C)  ?   Temp Source Oral  ?   SpO2 98 %  ?   Weight   ?   Height   ?   Head  Circumference   ?   Peak Flow   ?   Pain Score 2  ?   Pain Loc   ?   Pain Edu?   ?   Excl. in [place]?   ? ?No data found. ? ?Updated Vital Signs ?BP 114/74 (BP Location: Right Arm)   Pulse 65   Temp 98.2 ?F (36.8 ?C) (Oral)   Resp 18   SpO2 98%  ? ?Visual Acuity ?Right Eye Distance:   ?Left Eye Distance:   ?Bilateral Distance:   ? ?Right Eye Near:   ?Left Eye Near:    ?Bilateral Near:    ? ?Physical Exam ?Vitals reviewed.  ?Constitutional:   ?   General: He is awake.  ?   Appearance: Normal appearance. He is well-developed. He is not ill-appearing.  ?   Comments: Very pleasant male appears stated age in no acute distress sitting comfortably in exam room  ?HENT:  ?   Head: Normocephalic and atraumatic.  ?   Mouth/Throat:  ?   Pharynx: Uvula midline. No  oropharyngeal exudate or posterior oropharyngeal erythema.  ?Cardiovascular:  ?   Rate and Rhythm: Normal rate and regular rhythm.  ?   Pulses:     ?     Radial pulses are 2+ on the right side and 2+ on the left side.  ?   Heart sounds: Normal heart sounds, S1 normal and S2 normal. No murmur heard. ?   Comments: Capillary fill within 2 seconds right fifth digit ?Pulmonary:  ?   Effort: Pulmonary effort is normal.  ?   Breath sounds: Normal breath sounds. No stridor. No wheezing, rhonchi or rales.  ?Musculoskeletal:  ?   Right hand: Swelling and tenderness present. No deformity or bony tenderness. Decreased range of motion. Normal sensation. There is no disruption of two-point discrimination. Normal capillary refill.  ?   Comments: Right fifth digit: Decreased range of motion with flexion.  Normal extension.  Finger neurovascularly intact.  ?Neurological:  ?   Mental Status: He is alert.  ?Psychiatric:     ?   Behavior: Behavior is cooperative.  ? ? ? ?UC Treatments / Results  ?Labs ?(all labs ordered are listed, but only abnormal results are displayed) ?Labs Reviewed - No data to display ? ?EKG ? ? ?Radiology ?DG Finger Little Right ? ?Result Date: 09/13/2021 ?CLINICAL DATA:  Injury EXAM: RIGHT LITTLE FINGER 2+V COMPARISON:  None. FINDINGS: There is mild soft tissue swelling of the small finger. There is no evidence of acute fracture or dislocation. IMPRESSION: Mild soft tissue swelling of the small finger. No evidence of acute fracture or dislocation. Electronically Signed   By: Maurine Simmering M.D.   On: 09/13/2021 15:52   ? ?Procedures ?Procedures (including critical care time) ? ?Medications Ordered in UC ?Medications - No data to display ? ?Initial Impression / Assessment and Plan / UC Course  ?I have reviewed the triage vital signs and the nursing notes. ? ?Pertinent labs & imaging results that were available during my care of the patient were reviewed by me and considered in my medical decision making (see chart  for details). ? ?  ? ?X-ray obtained given mechanism of injury showed soft tissue swelling without acute fracture or dislocation.  Patient was placed in splint for protection and comfort.  Recommended RICE protocol.  He was prescribed ibuprofen to help manage pain.  Discussed that if symptoms or not improving quickly he should follow-up with hand specialist and was given contact  information for local provider with instruction to call to schedule an appointment.  Discussed that if he has any worsening symptoms he is to be seen immediately.  Strict return precautions given to which he expressed understanding. ? ?Final Clinical Impressions(s) / UC Diagnoses  ? ?Final diagnoses:  ?Injury of finger of right hand, initial encounter  ? ? ? ?Discharge Instructions   ? ?  ?Your x-ray showed no evidence of fracture or dislocation which is great news.  I would recommend keeping in a splint for protection for the next several days.  Use ice, elevation, compression for additional symptom relief.  If your symptoms are improving quickly please follow-up with hand specialist; call to schedule an appointment.  If you have any worsening symptoms including increased pain, significant swelling, numbness, paleness of the skin you need to be seen immediately. ? ? ? ? ?ED Prescriptions   ? ? Medication Sig Dispense Auth. Provider  ? ibuprofen (ADVIL) 600 MG tablet Take 1 tablet (600 mg total) by mouth every 8 (eight) hours as needed. 30 tablet Simrat Kendrick K, PA-C  ? ?  ? ?PDMP not reviewed this encounter. ?  ?Terrilee Croak, PA-C ?09/13/21 1607 ? ?

## 2021-09-13 NOTE — Discharge Instructions (Signed)
Your x-ray showed no evidence of fracture or dislocation which is great news.  I would recommend keeping in a splint for protection for the next several days.  Use ice, elevation, compression for additional symptom relief.  If your symptoms are improving quickly please follow-up with hand specialist; call to schedule an appointment.  If you have any worsening symptoms including increased pain, significant swelling, numbness, paleness of the skin you need to be seen immediately. ?

## 2021-10-24 DIAGNOSIS — E782 Mixed hyperlipidemia: Secondary | ICD-10-CM | POA: Diagnosis not present

## 2021-10-24 DIAGNOSIS — R7303 Prediabetes: Secondary | ICD-10-CM | POA: Diagnosis not present

## 2021-10-27 ENCOUNTER — Other Ambulatory Visit: Payer: Self-pay | Admitting: Internal Medicine

## 2021-10-27 ENCOUNTER — Other Ambulatory Visit (HOSPITAL_COMMUNITY): Payer: Self-pay | Admitting: Internal Medicine

## 2021-10-27 DIAGNOSIS — E782 Mixed hyperlipidemia: Secondary | ICD-10-CM

## 2021-12-14 ENCOUNTER — Ambulatory Visit (HOSPITAL_COMMUNITY)
Admission: RE | Admit: 2021-12-14 | Discharge: 2021-12-14 | Disposition: A | Discharge status: Home / Self Care | Payer: Self-pay | Source: Ambulatory Visit | Attending: Internal Medicine | Admitting: Internal Medicine

## 2021-12-14 DIAGNOSIS — E782 Mixed hyperlipidemia: Secondary | ICD-10-CM | POA: Insufficient documentation

## 2021-12-25 ENCOUNTER — Encounter: Payer: Self-pay | Admitting: Gastroenterology

## 2022-01-19 ENCOUNTER — Ambulatory Visit (AMBULATORY_SURGERY_CENTER): Payer: Self-pay | Admitting: *Deleted

## 2022-01-19 VITALS — Ht 70.0 in | Wt 231.2 lb

## 2022-01-19 DIAGNOSIS — Z1211 Encounter for screening for malignant neoplasm of colon: Secondary | ICD-10-CM

## 2022-01-19 MED ORDER — NA SULFATE-K SULFATE-MG SULF 17.5-3.13-1.6 GM/177ML PO SOLN: 1.0000 | Freq: Once | ORAL | 0 refills | Status: AC

## 2022-01-19 NOTE — Progress Notes (Signed)
No egg or soy allergy known to patient  No issues known to pt with past sedation with any surgeries or procedures Patient denies ever being told they had issues or difficulty with intubation  No FH of Malignant Hyperthermia Pt is not on diet pills Pt is not on  home 02  Pt is not on blood thinners  Pt denies issues with constipation  No A fib or A flutter Have any cardiac testing pending--no Pt instructed to use Single[employer][email or web address] or GoodRx for a price reduction on prep   

## 2022-01-24 ENCOUNTER — Encounter: Payer: Self-pay | Admitting: Gastroenterology

## 2022-02-02 ENCOUNTER — Ambulatory Visit
Admission: RE | Admit: 2022-02-02 | Discharge: 2022-02-02 | Disposition: A | Discharge status: Home / Self Care | Payer: 59 | Source: Ambulatory Visit | Attending: Nurse Practitioner | Admitting: Nurse Practitioner

## 2022-02-02 VITALS — BP 134/87 | HR 68 | Temp 97.6°F | Resp 18

## 2022-02-02 DIAGNOSIS — J069 Acute upper respiratory infection, unspecified: Secondary | ICD-10-CM | POA: Diagnosis not present

## 2022-02-02 DIAGNOSIS — H66001 Acute suppurative otitis media without spontaneous rupture of ear drum, right ear: Secondary | ICD-10-CM

## 2022-02-02 MED ORDER — AMOXICILLIN 875 MG PO TABS: 875.0000 mg | ORAL_TABLET | Freq: Two times a day (BID) | ORAL | 0 refills | Status: AC

## 2022-02-02 NOTE — ED Provider Notes (Signed)
[facility]    CSN: [id number] Arrival date & time: 02/02/22  1136      History   Chief Complaint Chief Complaint  Patient presents with   Ear Injury    Ear injury and head cold. May or may not be related. - Entered by patient   Fever   Headache    HPI James Powers is a 46 y.o. male.   Patient presents with 3 days of right ear pain.  He denies drainage from the ear.  Reports the pain is sharp and constant.  No recent fevers, use of Q-tips, or hearing loss.  Reports he was scuba diving when the ear pain began.  He endorses had cold symptoms for the past 5 days including nasal congestion, postnasal drainage, sore throat, headache.  No chest pain, shortness of breath, abdominal pain, nausea/vomiting, diarrhea.  Reports his sense of taste has been intermittently altered.  Denies antibiotic use in the past 90 days.     Past Medical History:  Diagnosis Date   Arthritis    shoulder   Complication of anesthesia    Does not numb well and woke up during surgery   Dizziness and giddiness    GERD (gastroesophageal reflux disease)    Migraine headache    Screening for cardiovascular condition     Patient Active Problem List   Diagnosis Date Noted   Chronic urticaria 12/02/2020   Allergic reaction 12/02/2020    Past Surgical History:  Procedure Laterality Date   KNEE ARTHROSCOPY WITH MEDIAL MENISECTOMY Left 07/26/2021   Procedure: KNEE ARTHROSCOPY WITH MEDIAL MENISECTOMY;  Surgeon: Vickey Huger, MD;  Location: [facility];  Service: Orthopedics;  Laterality: Left;   KNEE SURGERY Left    molars     removed at 46 years old [place] Bilateral    SHOULDER SURGERY Left    WISDOM TOOTH EXTRACTION         Home Medications    Prior to Admission medications   Medication Sig Start Date End Date Taking? Authorizing Provider  amoxicillin (AMOXIL) 875 MG tablet Take 1 tablet (875 mg total) by mouth 2 (two) times daily for 5 days. 02/02/22 02/07/22 Yes  Noemi Chapel A, NP  EPINEPHrine (AUVI-Q) 0.3 mg/0.3 mL IJ SOAJ injection Inject 0.3 mg into the muscle as needed for anaphylaxis. Patient not taking: Reported on 01/19/2022 01/13/21   Valentina Shaggy, MD    Family History Family History  Problem Relation Age of Onset   Colon cancer Neg Hx    Colon polyps Neg Hx    Crohn's disease Neg Hx    Esophageal cancer Neg Hx    Rectal cancer Neg Hx    Stomach cancer Neg Hx    Ulcerative colitis Neg Hx     Social History Social History   Tobacco Use   Smoking status: Never    Passive exposure: Never   Smokeless tobacco: Never  Vaping Use   Vaping Use: Never used  Substance Use Topics   Alcohol use: Never   Drug use: Never     Allergies   Oxycodone and Alpha-gal   Review of Systems Review of Systems Per HPI  Physical Exam Triage Vital Signs ED Triage Vitals  Enc Vitals Group     BP 02/02/22 1159 134/87     Pulse Rate 02/02/22 1159 68     Resp 02/02/22 1159 18     Temp 02/02/22 1159 97.6 F (36.4 C)     Temp  Source 02/02/22 1159 Oral     SpO2 02/02/22 1159 97 %     Weight --      Height --      Head Circumference --      Peak Flow --      Pain Score 02/02/22 1202 0     Pain Loc --      Pain Edu? --      Excl. in [place]? --    No data found.  Updated Vital Signs BP 134/87   Pulse 68   Temp 97.6 F (36.4 C) (Oral)   Resp 18   SpO2 97%   Visual Acuity Right Eye Distance:   Left Eye Distance:   Bilateral Distance:    Right Eye Near:   Left Eye Near:    Bilateral Near:     Physical Exam Vitals and nursing note reviewed.  Constitutional:      General: He is not in acute distress.    Appearance: Normal appearance. He is not ill-appearing or toxic-appearing.  HENT:     Head: Normocephalic and atraumatic.     Right Ear: Ear canal and external ear normal. Tympanic membrane is injected, erythematous and bulging. Tympanic membrane is not perforated.     Left Ear: Ear canal and external ear normal. A  middle ear effusion is present.     Nose: Congestion and rhinorrhea present.     Mouth/Throat:     Mouth: Mucous membranes are moist.     Pharynx: Oropharynx is clear. Posterior oropharyngeal erythema present. No oropharyngeal exudate.  Eyes:     General: No scleral icterus.    Extraocular Movements: Extraocular movements intact.  Cardiovascular:     Rate and Rhythm: Normal rate and regular rhythm.  Pulmonary:     Effort: Pulmonary effort is normal. No respiratory distress.     Breath sounds: Normal breath sounds. No wheezing, rhonchi or rales.  Musculoskeletal:     Cervical back: Normal range of motion and neck supple.  Lymphadenopathy:     Cervical: No cervical adenopathy.  Skin:    General: Skin is warm and dry.     Coloration: Skin is not jaundiced or pale.     Findings: No erythema or rash.  Neurological:     Mental Status: He is alert and oriented to person, place, and time.  Psychiatric:        Behavior: Behavior is cooperative.      UC Treatments / Results  Labs (all labs ordered are listed, but only abnormal results are displayed) Labs Reviewed - No data to display  EKG   Radiology No results found.  Procedures Procedures (including critical care time)  Medications Ordered in UC Medications - No data to display  Initial Impression / Assessment and Plan / UC Course  I have reviewed the triage vital signs and the nursing notes.  Pertinent labs & imaging results that were available during my care of the patient were reviewed by me and considered in my medical decision making (see chart for details).    Patient is well-appearing, normotensive, afebrile, not tachycardic, not tachypneic, oxygenating well on room air.  On examination, he has otitis media of the right ear without rupture of tympanic membrane.  Treat with amoxicillin 875 mg twice daily for 5 days.  Discussed that he also has COVID-19 symptoms, however testing deferred at this time.  Supportive care  discussed briefly.  Encouraged masking until symptoms improve.  The patient was given the opportunity to  ask questions.  All questions answered to their satisfaction.  The patient is in agreement to this plan.   Final Clinical Impressions(s) / UC Diagnoses   Final diagnoses:  Non-recurrent acute suppurative otitis media of right ear without spontaneous rupture of tympanic membrane     Discharge Instructions      Please take the amoxicillin as prescribed for the ear infection.    Continue the following supportive care for your upper respiratory symptoms: - Increased rest - Increasing fluid with water/sugar free electrolytes - Acetaminophen and ibuprofen as needed for fever/pain.  - Salt water gargling, chloraseptic spray and throat lozenges - OTC guaifenesin (Mucinex).  - Saline sinus flushes or a neti pot.  - Humidifying the air.  Seek care here or with your primary care provider if your symptoms or not improved after about a week or so.   ED Prescriptions     Medication Sig Dispense Auth. Provider   amoxicillin (AMOXIL) 875 MG tablet Take 1 tablet (875 mg total) by mouth 2 (two) times daily for 5 days. 10 tablet Eulogio Bear, NP      PDMP not reviewed this encounter.   Eulogio Bear, NP 02/02/22 1233

## 2022-02-02 NOTE — ED Triage Notes (Signed)
Pt is present today with c/o right ear pain. Pt states that he recently went scuba diving and noticed the pain after. Pt states that he noticed the pain Tuesday.   Pt states that he also has a HA, itch throat, congestion, and fever. Pt sx started Sunday

## 2022-02-02 NOTE — Discharge Instructions (Addendum)
Please take the amoxicillin as prescribed for the ear infection.    Continue the following supportive care for your upper respiratory symptoms: - Increased rest - Increasing fluid with water/sugar free electrolytes - Acetaminophen and ibuprofen as needed for fever/pain.  - Salt water gargling, chloraseptic spray and throat lozenges - OTC guaifenesin (Mucinex).  - Saline sinus flushes or a neti pot.  - Humidifying the air.  Seek care here or with your primary care provider if your symptoms or not improved after about a week or so.

## 2022-02-09 ENCOUNTER — Ambulatory Visit (AMBULATORY_SURGERY_CENTER): Payer: 59 | Admitting: Gastroenterology

## 2022-02-09 ENCOUNTER — Encounter: Payer: Self-pay | Admitting: Gastroenterology

## 2022-02-09 VITALS — BP 109/71 | HR 57 | Temp 98.0°F | Resp 14 | Ht 70.0 in | Wt 231.0 lb

## 2022-02-09 DIAGNOSIS — K635 Polyp of colon: Secondary | ICD-10-CM | POA: Diagnosis not present

## 2022-02-09 DIAGNOSIS — D124 Benign neoplasm of descending colon: Secondary | ICD-10-CM

## 2022-02-09 DIAGNOSIS — Z1211 Encounter for screening for malignant neoplasm of colon: Secondary | ICD-10-CM | POA: Diagnosis not present

## 2022-02-09 MED ORDER — SODIUM CHLORIDE 0.9 % IV SOLN: 500.0000 mL | INTRAVENOUS | Status: DC

## 2022-02-09 NOTE — Progress Notes (Signed)
GASTROENTEROLOGY PROCEDURE H&P NOTE   Primary Care Physician: [name], MD    Reason for Procedure:  Colon Cancer screening  Plan:    Colonoscopy  Patient is appropriate for endoscopic procedure(s) in the ambulatory ([facility]) setting.  The nature of the procedure, as well as the risks, benefits, and alternatives were carefully and thoroughly reviewed with the patient. Ample time for discussion and questions allowed. The patient understood, was satisfied, and agreed to proceed.     HPI: James Powers is a 46 y.o. male who presents for colonoscopy for routine Colon Cancer screening.  No active GI symptoms.  No known family history of colon cancer or related malignancy.  Patient is otherwise without complaints or active issues today.  Past Medical History:  Diagnosis Date   Arthritis    shoulder   Complication of anesthesia    Does not numb well and woke up during surgery   Dizziness and giddiness    GERD (gastroesophageal reflux disease)    Migraine headache    Screening for cardiovascular condition     Past Surgical History:  Procedure Laterality Date   KNEE ARTHROSCOPY WITH MEDIAL MENISECTOMY Left 07/26/2021   Procedure: KNEE ARTHROSCOPY WITH MEDIAL MENISECTOMY;  Surgeon: Vickey Huger, MD;  Location: [facility];  Service: Orthopedics;  Laterality: Left;   KNEE SURGERY Left    molars     removed at 46 years old [place] Bilateral    SHOULDER SURGERY Left    WISDOM TOOTH EXTRACTION      Prior to Admission medications   Medication Sig Start Date End Date Taking? Authorizing Provider  EPINEPHrine (AUVI-Q) 0.3 mg/0.3 mL IJ SOAJ injection Inject 0.3 mg into the muscle as needed for anaphylaxis. Patient not taking: Reported on 01/19/2022 01/13/21   Valentina Shaggy, MD    Current Outpatient Medications  Medication Sig Dispense Refill   EPINEPHrine (AUVI-Q) 0.3 mg/0.3 mL IJ SOAJ injection Inject 0.3 mg into the muscle as needed for anaphylaxis. (Patient  not taking: Reported on 01/19/2022) 2 each 1   Current Facility-Administered Medications  Medication Dose Route Frequency Provider Last Rate Last Admin   0.9 %  sodium chloride infusion  500 mL Intravenous Continuous Rosamae Rocque V, DO        Allergies as of 02/09/2022 - Review Complete 02/09/2022  Allergen Reaction Noted   Oxycodone Nausea And Vomiting 07/26/2021   Alpha-gal Itching and Rash 07/12/2021    Family History  Problem Relation Age of Onset   Colon cancer Neg Hx    Colon polyps Neg Hx    Crohn's disease Neg Hx    Esophageal cancer Neg Hx    Rectal cancer Neg Hx    Stomach cancer Neg Hx    Ulcerative colitis Neg Hx     Social History   Socioeconomic History   Marital status: Married    Spouse name: Not on file   Number of children: Not on file   Years of education: Not on file   Highest education level: Not on file  Occupational History   Not on file  Tobacco Use   Smoking status: Never    Passive exposure: Never   Smokeless tobacco: Never  Vaping Use   Vaping Use: Never used  Substance and Sexual Activity   Alcohol use: Never   Drug use: Never   Sexual activity: Not on file  Other Topics Concern   Not on file  Social History Narrative   Not on  file   Social Determinants of Health   Financial Resource Strain: Not on file  Food Insecurity: Not on file  Transportation Needs: Not on file  Physical Activity: Not on file  Stress: Not on file  Social Connections: Not on file  Intimate Partner Violence: Not on file    Physical Exam: Vital signs in last 24 hours: '@BP'$  113/74   Pulse 70   Temp 98 F (36.7 C)   Ht '5\' 10"'$  (1.778 m)   Wt 231 lb (104.8 kg)   SpO2 100%   BMI 33.15 kg/m  GEN: NAD EYE: Sclerae anicteric ENT: MMM CV: Non-tachycardic Pulm: CTA b/l GI: Soft, NT/ND NEURO:  Alert & Oriented x 3   Gerrit Heck, DO [facility]   02/09/2022 10:49 AM

## 2022-02-09 NOTE — Patient Instructions (Signed)
   Handout on polyps given to you today  Await pathology results on polyps removed    YOU HAD AN ENDOSCOPIC PROCEDURE TODAY AT THE [facility]:   Refer to the procedure report that was given to you for any specific questions about what was found during the examination.  If the procedure report does not answer your questions, please call your gastroenterologist to clarify.  If you requested that your care partner not be given the details of your procedure findings, then the procedure report has been included in a sealed envelope for you to review at your convenience later.  YOU SHOULD EXPECT: Some feelings of bloating in the abdomen. Passage of more gas than usual.  Walking can help get rid of the air that was put into your GI tract during the procedure and reduce the bloating. If you had a lower endoscopy (such as a colonoscopy or flexible sigmoidoscopy) you may notice spotting of blood in your stool or on the toilet paper. If you underwent a bowel prep for your procedure, you may not have a normal bowel movement for a few days.  Please Note:  You might notice some irritation and congestion in your nose or some drainage.  This is from the oxygen used during your procedure.  There is no need for concern and it should clear up in a day or so.  SYMPTOMS TO REPORT IMMEDIATELY:  Following lower endoscopy (colonoscopy or flexible sigmoidoscopy):  Excessive amounts of blood in the stool  Significant tenderness or worsening of abdominal pains  Swelling of the abdomen that is new, acute  Fever of 100F or higher   For urgent or emergent issues, a gastroenterologist can be reached at any hour by calling [phone]. Do not use MyChart messaging for urgent concerns.    DIET:  We do recommend a small meal at first, but then you may proceed to your regular diet.  Drink plenty of fluids but you should avoid alcoholic beverages for 24 hours.  ACTIVITY:  You should plan to take it easy for  the rest of today and you should NOT DRIVE or use heavy machinery until tomorrow (because of the sedation medicines used during the test).    FOLLOW UP: Our staff will call the number listed on your records the next business day following your procedure.  We will call around 7:15- 8:00 am to check on you and address any questions or concerns that you may have regarding the information given to you following your procedure. If we do not reach you, we will leave a message.     If any biopsies were taken you will be contacted by phone or by letter within the next 1-3 weeks.  Please call us at [phone] if you have not heard about the biopsies in 3 weeks.    SIGNATURES/CONFIDENTIALITY: You and/or your care partner have signed paperwork which will be entered into your electronic medical record.  These signatures attest to the fact that that the information above on your After Visit Summary has been reviewed and is understood.  Full responsibility of the confidentiality of this discharge information lies with you and/or your care-partner. 

## 2022-02-09 NOTE — Progress Notes (Signed)
Called to room to assist during endoscopic procedure.  Patient ID and intended procedure confirmed with present staff. Received instructions for my participation in the procedure from the performing physician.  

## 2022-02-09 NOTE — Progress Notes (Signed)
A and O x3. Report to RN. Tolerated MAC anesthesia well. 

## 2022-02-09 NOTE — Op Note (Signed)
[place] Patient Name: James Powers Procedure Date: 02/09/2022 10:44 AM MRN: [id number] Endoscopist: Gerrit Heck , MD Age: 46 Referring MD:  Date of Birth: 09/03/1975 Gender: Male Account #: [id number] Procedure:                Colonoscopy Indications:              Screening for colorectal malignant neoplasm, This                            is the patient's first colonoscopy Medicines:                Monitored Anesthesia Care Procedure:                Pre-Anesthesia Assessment:                           - Prior to the procedure, a History and Physical                            was performed, and patient medications and                            allergies were reviewed. The patient's tolerance of                            previous anesthesia was also reviewed. The risks                            and benefits of the procedure and the sedation                            options and risks were discussed with the patient.                            All questions were answered, and informed consent                            was obtained. Prior Anticoagulants: The patient has                            taken no previous anticoagulant or antiplatelet                            agents. ASA Grade Assessment: II - A patient with                            mild systemic disease. After reviewing the risks                            and benefits, the patient was deemed in                            satisfactory condition to undergo the procedure.  After obtaining informed consent, the colonoscope                            was passed under direct vision. Throughout the                            procedure, the patient's blood pressure, pulse, and                            oxygen saturations were monitored continuously. The                            Olympus CF-HQ190L [id number]) Colonoscope was                            introduced through the anus and  advanced to the the                            terminal ileum. The colonoscopy was performed                            without difficulty. The patient tolerated the                            procedure well. The quality of the bowel                            preparation was excellent. The terminal ileum,                            ileocecal valve, appendiceal orifice, and rectum                            were photographed. Scope In: 10:57:34 AM Scope Out: 11:11:05 AM Scope Withdrawal Time: 0 hours 10 minutes 38 seconds  Total Procedure Duration: 0 hours 13 minutes 31 seconds  Findings:                 The perianal and digital rectal examinations were                            normal.                           Two sessile polyps were found in the descending                            colon. The polyps were 2 to 5 mm in size. These                            polyps were removed with a cold snare. Resection                            and retrieval were complete. Estimated blood loss  was minimal.                           The exam was otherwise normal throughout the                            remainder of the colon.                           The retroflexed view of the distal rectum and anal                            verge was normal and showed no anal or rectal                            abnormalities.                           The terminal ileum appeared normal. Complications:            No immediate complications. Estimated Blood Loss:     Estimated blood loss was minimal. Impression:               - Two 2 to 5 mm polyps in the descending colon,                            removed with a cold snare. Resected and retrieved.                           - The distal rectum and anal verge are normal on                            retroflexion view.                           - The examined portion of the ileum was normal. Recommendation:           - Patient has a  contact number available for                            emergencies. The signs and symptoms of potential                            delayed complications were discussed with the                            patient. Return to normal activities tomorrow.                            Written discharge instructions were provided to the                            patient.                           - Resume previous diet.                           -  Continue present medications.                           - Await pathology results.                           - Repeat colonoscopy for surveillance based on                            pathology results.                           - Return to GI office PRN. Gerrit Heck, MD 02/09/2022 11:15:16 AM

## 2022-02-12 ENCOUNTER — Telehealth: Payer: Self-pay | Admitting: *Deleted

## 2022-02-12 NOTE — Telephone Encounter (Signed)
Attempted f/u phone call. No answer. Left message. °

## 2022-04-27 DIAGNOSIS — R7303 Prediabetes: Secondary | ICD-10-CM | POA: Diagnosis not present

## 2022-04-27 DIAGNOSIS — E782 Mixed hyperlipidemia: Secondary | ICD-10-CM | POA: Diagnosis not present

## 2022-05-07 DIAGNOSIS — E669 Obesity, unspecified: Secondary | ICD-10-CM | POA: Diagnosis not present

## 2022-05-07 DIAGNOSIS — G43009 Migraine without aura, not intractable, without status migrainosus: Secondary | ICD-10-CM | POA: Diagnosis not present

## 2022-05-07 DIAGNOSIS — E782 Mixed hyperlipidemia: Secondary | ICD-10-CM | POA: Diagnosis not present

## 2022-05-07 DIAGNOSIS — E7521 Fabry (-Anderson) disease: Secondary | ICD-10-CM | POA: Diagnosis not present

## 2022-05-07 DIAGNOSIS — R7303 Prediabetes: Secondary | ICD-10-CM | POA: Diagnosis not present

## 2022-05-07 DIAGNOSIS — T781XXD Other adverse food reactions, not elsewhere classified, subsequent encounter: Secondary | ICD-10-CM | POA: Diagnosis not present

## 2022-05-07 DIAGNOSIS — R0781 Pleurodynia: Secondary | ICD-10-CM | POA: Diagnosis not present

## 2022-05-07 DIAGNOSIS — S83207D Unspecified tear of unspecified meniscus, current injury, left knee, subsequent encounter: Secondary | ICD-10-CM | POA: Diagnosis not present

## 2022-05-07 DIAGNOSIS — Z91018 Allergy to other foods: Secondary | ICD-10-CM | POA: Diagnosis not present

## 2022-05-07 DIAGNOSIS — R945 Abnormal results of liver function studies: Secondary | ICD-10-CM | POA: Diagnosis not present

## 2022-05-07 DIAGNOSIS — R109 Unspecified abdominal pain: Secondary | ICD-10-CM | POA: Diagnosis not present

## 2022-05-07 DIAGNOSIS — G43909 Migraine, unspecified, not intractable, without status migrainosus: Secondary | ICD-10-CM | POA: Diagnosis not present

## 2022-05-27 IMAGING — DX DG FINGER LITTLE 2+V*R*
3 series · 3 of 3 positions shown · non-contrast
Comparison: None.

CLINICAL DATA: Injury

EXAM:
RIGHT LITTLE FINGER 2+V

[finger ap]
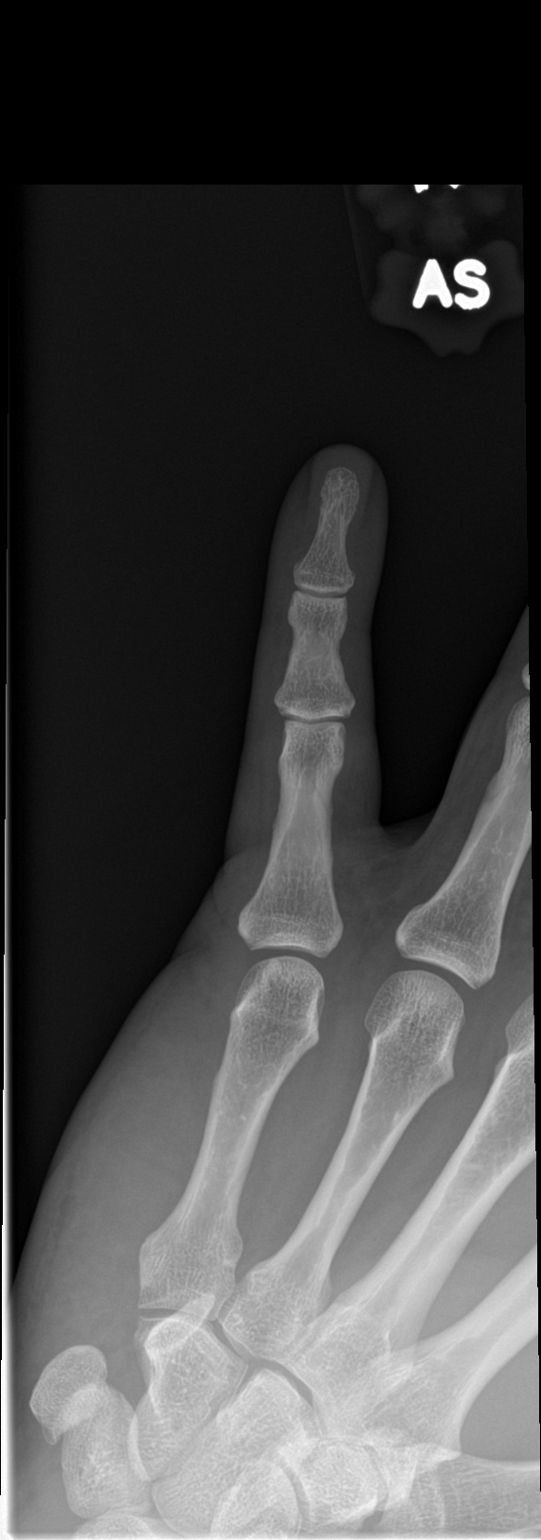

[finger obl]
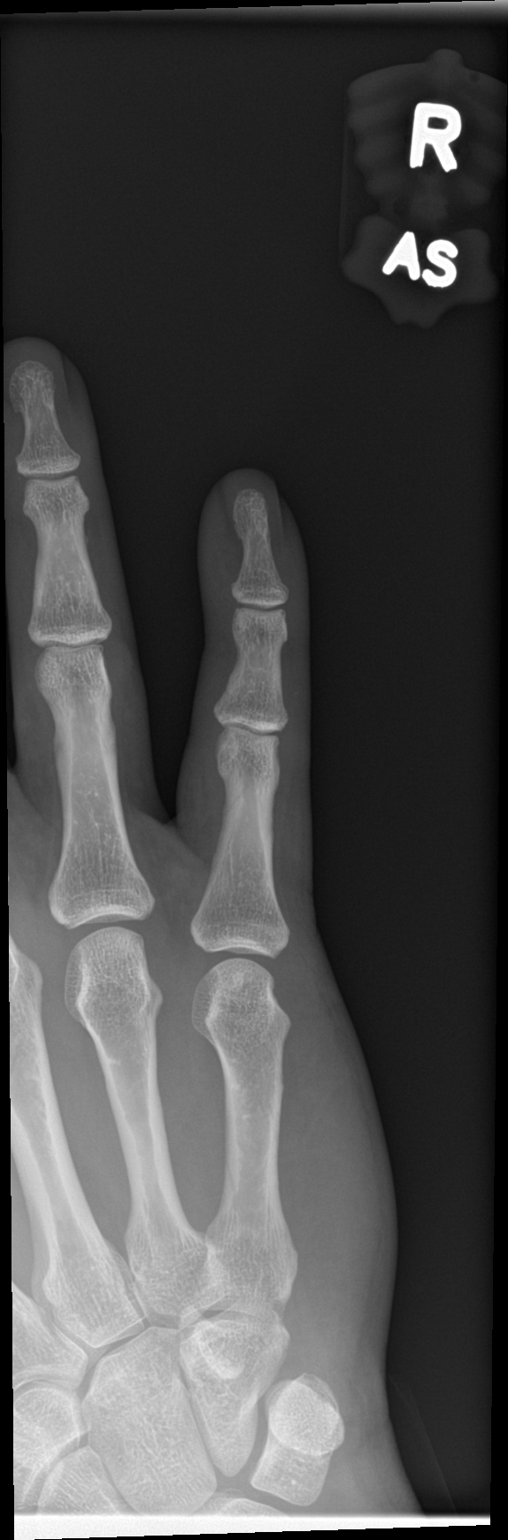

[finger lat]
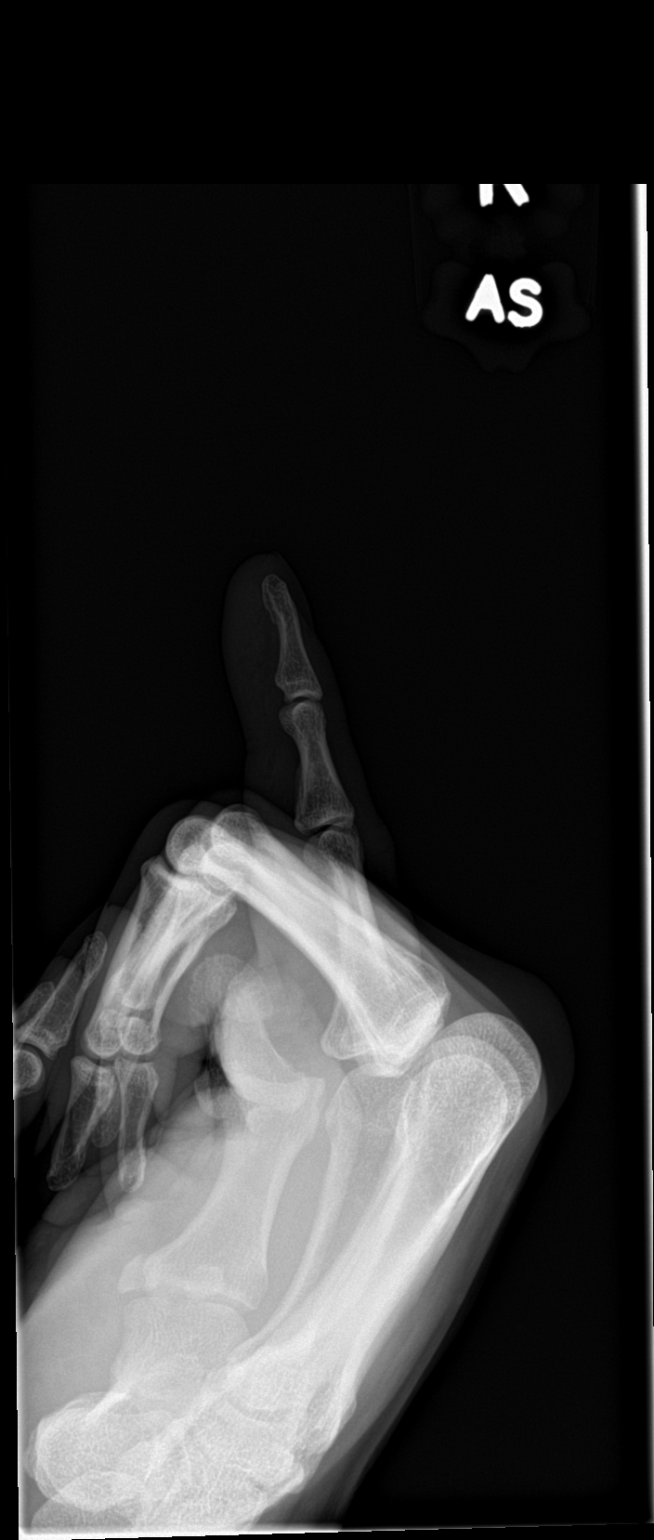

[3 of 3 positions shown; findings below may reference images not displayed]

FINDINGS: There is mild soft tissue swelling of the small finger. There is no
evidence of acute fracture or dislocation.
IMPRESSION: Mild soft tissue swelling of the small finger. No evidence of acute
fracture or dislocation.

## 2022-12-18 DIAGNOSIS — M25562 Pain in left knee: Secondary | ICD-10-CM | POA: Diagnosis not present

## 2022-12-18 DIAGNOSIS — G8929 Other chronic pain: Secondary | ICD-10-CM | POA: Diagnosis not present

## 2022-12-18 DIAGNOSIS — M1712 Unilateral primary osteoarthritis, left knee: Secondary | ICD-10-CM | POA: Diagnosis not present

## 2023-01-18 DIAGNOSIS — R7303 Prediabetes: Secondary | ICD-10-CM | POA: Diagnosis not present

## 2023-01-18 DIAGNOSIS — E782 Mixed hyperlipidemia: Secondary | ICD-10-CM | POA: Diagnosis not present

## 2023-01-18 DIAGNOSIS — T781XXD Other adverse food reactions, not elsewhere classified, subsequent encounter: Secondary | ICD-10-CM | POA: Diagnosis not present

## 2023-01-22 DIAGNOSIS — Z91018 Allergy to other foods: Secondary | ICD-10-CM | POA: Diagnosis not present

## 2023-01-22 DIAGNOSIS — R945 Abnormal results of liver function studies: Secondary | ICD-10-CM | POA: Diagnosis not present

## 2023-01-22 DIAGNOSIS — R748 Abnormal levels of other serum enzymes: Secondary | ICD-10-CM | POA: Diagnosis not present

## 2023-01-22 DIAGNOSIS — X58XXXD Exposure to other specified factors, subsequent encounter: Secondary | ICD-10-CM | POA: Diagnosis not present

## 2023-01-22 DIAGNOSIS — E785 Hyperlipidemia, unspecified: Secondary | ICD-10-CM | POA: Diagnosis not present

## 2023-01-22 DIAGNOSIS — G43009 Migraine without aura, not intractable, without status migrainosus: Secondary | ICD-10-CM | POA: Diagnosis not present

## 2023-01-22 DIAGNOSIS — R7401 Elevation of levels of liver transaminase levels: Secondary | ICD-10-CM | POA: Diagnosis not present

## 2023-01-22 DIAGNOSIS — E669 Obesity, unspecified: Secondary | ICD-10-CM | POA: Diagnosis not present

## 2023-01-22 DIAGNOSIS — S83207D Unspecified tear of unspecified meniscus, current injury, left knee, subsequent encounter: Secondary | ICD-10-CM | POA: Diagnosis not present

## 2023-01-22 DIAGNOSIS — Z713 Dietary counseling and surveillance: Secondary | ICD-10-CM | POA: Diagnosis not present

## 2023-01-22 DIAGNOSIS — E782 Mixed hyperlipidemia: Secondary | ICD-10-CM | POA: Diagnosis not present

## 2023-01-22 DIAGNOSIS — R7303 Prediabetes: Secondary | ICD-10-CM | POA: Diagnosis not present

## 2023-08-22 DIAGNOSIS — R7303 Prediabetes: Secondary | ICD-10-CM | POA: Diagnosis not present

## 2023-08-22 DIAGNOSIS — Z125 Encounter for screening for malignant neoplasm of prostate: Secondary | ICD-10-CM | POA: Diagnosis not present

## 2023-08-22 DIAGNOSIS — E782 Mixed hyperlipidemia: Secondary | ICD-10-CM | POA: Diagnosis not present
